# Patient Record
Sex: Female | Born: 2000 | Race: White | Hispanic: No | Marital: Married | Smoking: Never smoker
Health system: Southern US, Community
[De-identification: ages and names within clinical notes are randomized; demographics above are authoritative.]

## PROBLEM LIST (undated history)

## (undated) DIAGNOSIS — L709 Acne, unspecified: Secondary | ICD-10-CM

## (undated) DIAGNOSIS — K5790 Diverticulosis of intestine, part unspecified, without perforation or abscess without bleeding: Secondary | ICD-10-CM

## (undated) HISTORY — DX: Diverticulosis of intestine, part unspecified, without perforation or abscess without bleeding: K57.90

## (undated) HISTORY — PX: WISDOM TOOTH EXTRACTION: SHX21

## (undated) HISTORY — DX: Acne, unspecified: L70.9

## (undated) HISTORY — PX: COSMETIC SURGERY: SHX468

---
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
## 2018-01-17 ENCOUNTER — Encounter: Payer: Self-pay | Admitting: Certified Nurse Midwife

## 2018-01-17 ENCOUNTER — Ambulatory Visit: Payer: BLUE CROSS/BLUE SHIELD | Admitting: Certified Nurse Midwife

## 2018-01-17 ENCOUNTER — Other Ambulatory Visit: Payer: Self-pay

## 2018-01-17 VITALS — BP 110/70 | HR 70 | Resp 16 | Ht 63.75 in | Wt 159.0 lb

## 2018-01-17 DIAGNOSIS — Z01419 Encounter for gynecological examination (general) (routine) without abnormal findings: Secondary | ICD-10-CM

## 2018-01-17 DIAGNOSIS — N920 Excessive and frequent menstruation with regular cycle: Secondary | ICD-10-CM | POA: Diagnosis not present

## 2018-01-17 DIAGNOSIS — N946 Dysmenorrhea, unspecified: Secondary | ICD-10-CM | POA: Diagnosis not present

## 2018-01-17 LAB — HEMOGLOBIN: Hemoglobin: 14.3

## 2018-01-17 MED ORDER — NORETHINDRONE 0.35 MG PO TABS: 1.0000 | ORAL_TABLET | Freq: Every day | ORAL | 1 refills | Status: DC

## 2018-01-17 NOTE — Progress Notes (Signed)
17 y.o. G0P0000 Single  Caucasian Fe here for annual exam. Periods are 6-7 duration, first 4 days heavy with cramping and then moderate to light, some PMS. Not sexually active ever. Previous use of OCP for cycle control only with some good results. Patient had gained weight with use and stopped. Periods are still uncomfortable and very heavy, would like to discuss other options for management. Social stress with mother's diagnosis of breast cancer, first chemo today. Has returned home from [place] to attend at [employer] to be able to help. No other health issues today.     Patient's last menstrual period was 12/29/2017 (exact date).          Sexually active: No.  The current method of family planning is abstinence.  Never sexually active  Exercising: Yes.    cardio Smoker:  no  Health Maintenance: Pap:  never History of Abnormal Pap: no MMG:  none Self Breast exams: no Colonoscopy:  none BMD:   none TDaP:  2018 Shingles: no Pneumonia: no Hep C and HIV: not done Labs: if needed  POCT Hgb 14.3   reports that she has never smoked. She has never used smokeless tobacco. She reports that she does not drink alcohol or use drugs.  Past Medical History:  Diagnosis Date  . Acne     Past Surgical History:  Procedure Laterality Date  . COSMETIC SURGERY     removal of birthmark  . WISDOM TOOTH EXTRACTION      Current Outpatient Medications  Medication Sig Dispense Refill  . clindamycin-benzoyl peroxide (BENZACLIN) gel APPLY TO FACE EVERY MORNING  2  . tretinoin (RETIN-A) 0.05 % cream APPLY TO FACE EVERY EVENING  1   No current facility-administered medications for this visit.     Family History  Problem Relation Age of Onset  . Diabetes Mother   . Cancer Mother 30       Breast   . Diabetes Sister   . Hypertension Maternal Grandmother   . Hypertension Maternal Grandfather   . Throat cancer Paternal Grandfather     ROS:  Pertinent items are noted in HPI.  Otherwise, a  comprehensive ROS was negative.  Exam:   BP 110/70   Pulse 70   Resp 16   Ht 5' 3.75" (1.619 m)   Wt 159 lb (72.1 kg)   LMP 12/29/2017 (Exact Date)   BMI 27.51 kg/m  Height: 5' 3.75" (161.9 cm) Ht Readings from Last 3 Encounters:  01/17/18 5' 3.75" (1.619 m) (42 %, Z= -0.20)*  11/16/17  (1.626 m) (46 %, Z= -0.10)*  12/19/16  (1.6 m) (32 %, Z= -0.47)*   * Growth percentiles are based on CDC (Girls, 2-20 Years) data.    General appearance: alert, cooperative and appears stated age Head: Normocephalic, without obvious abnormality, atraumatic Neck: no adenopathy, supple, symmetrical, trachea midline and thyroid normal to inspection and palpation Lungs: clear to auscultation bilaterally Breasts: normal appearance, no masses or tenderness, No nipple retraction or dimpling, No nipple discharge or bleeding, No axillary or supraclavicular adenopathy, Taught monthly breast self examination Heart: regular rate and rhythm Abdomen: soft, non-tender; no masses,  no organomegaly Extremities: extremities normal, atraumatic, no cyanosis or edema Skin: Skin color, texture, turgor normal. No rashes or lesions Lymph nodes: Cervical, supraclavicular, and axillary nodes normal. No abnormal inguinal nodes palpated Neurologic: Grossly normal   Pelvic: External genitalia:  no lesions, normal female for her age  Urethra:  normal appearing urethra with no masses, tenderness or lesions              Bartholin's and Skene's: normal                 Vagina: normal appearing vagina with normal color and discharge, no lesions              Cervix: no cervical motion tenderness, no lesions and nulliparous appearance              Pap taken: No. Bimanual Exam:  Uterus:  normal size, contour, position, consistency, mobility, non-tender and mid position              Adnexa: normal adnexa and no mass, fullness, tenderness               Rectovaginal: Confirms               Anus:  normal  appearance  Chaperone present: yes  A:  Well Woman with normal exam  Menorrhagia with dysmenorrhea desires cycle control  Social stress with mother's breast cancer diagnosis  Contraception options  P:   Reviewed health and wellness pertinent to exam  Discussed options for management with POP, Nexplanon, IUD, Depo Provera, risks/benefits/warning signs, expectations. Discussed since she has done pills prior with no problems with consistent use this may be a good option to start. Patient agreeable, declines any other form. Discussed expectations with POP of normal period, spotting, or no period. Will have 3 month adjustment period at onset and should help with heavy cycle. Instructions given to start on first day of period and continue daily, no off days. Voiced understanding. Will need 3 month evaluation. Discussed Midol use at onset of period symptoms, prior to bleeding to see if this helps with cramping. Hydrate well with water also questions addressed.  Discussed OCP has not shown increase risk of breast cancer due to estrogen, but understand her concern. Encouraged to see other family and friend and church support regarding her emotional needs.  Pap smear: no  counseled on breast self exam, use and side effects of OCP's, adequate intake of calcium and vitamin D, diet and exercise which help with menstrual cycle.  return annually or prn, as above  An After Visit Summary was printed and given to the patient.

## 2018-01-17 NOTE — Patient Instructions (Signed)
Take one pill daily every day one pack to the next. Call if issues.

## 2018-04-12 ENCOUNTER — Ambulatory Visit (INDEPENDENT_AMBULATORY_CARE_PROVIDER_SITE_OTHER): Payer: BLUE CROSS/BLUE SHIELD | Admitting: Certified Nurse Midwife

## 2018-04-12 ENCOUNTER — Encounter: Payer: Self-pay | Admitting: Certified Nurse Midwife

## 2018-04-12 ENCOUNTER — Other Ambulatory Visit: Payer: Self-pay

## 2018-04-12 VITALS — BP 126/82 | HR 66 | Resp 14 | Ht 63.0 in | Wt 162.5 lb

## 2018-04-12 DIAGNOSIS — Z3041 Encounter for surveillance of contraceptive pills: Secondary | ICD-10-CM

## 2018-04-12 DIAGNOSIS — N920 Excessive and frequent menstruation with regular cycle: Secondary | ICD-10-CM | POA: Diagnosis not present

## 2018-04-12 MED ORDER — NORETHINDRONE 0.35 MG PO TABS: 1.0000 | ORAL_TABLET | Freq: Every day | ORAL | 3 refills | Status: DC

## 2018-04-12 NOTE — Progress Notes (Signed)
17 y.o. Single Caucasian G0P0000 here for evaluation of Ashlee Schmidt initiated on 01/27/18 for cycle control for menorrhagia and dysmenorrhea. Menses duration 4 days with lighter flow just recently. Patient taking medication as prescribed. Denies  headaches, nausea, DVT warning signs or symptoms, no breakthrough bleeding, or other changes.   Keeping menses calendar.  Still caring for mother who is continuing with treatment for breast cancer and feel stress is contributing to not adjusting as well. 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  No other health issues today  O: Healthy female, WD WN Affect: normal orientation X 3    A: History of  Menorrhagia and dysmenorrhea cycle improving from patient perspective.   P: Continue Camilla as prescribed and advise if does not continue improve cycle control.   17 minutes spent with patient regarding contraception management

## 2018-06-26 ENCOUNTER — Telehealth: Payer: Self-pay | Admitting: Certified Nurse Midwife

## 2018-06-26 NOTE — Telephone Encounter (Signed)
Reviewed with Leota Sauers, CNM. Call returned to patient, Leota Sauers, CNM, agrees with recommendations, f/u with PCP for further evaluation. Patient verbalizes understanding and is agreeable. Encounter closed.

## 2018-06-26 NOTE — Telephone Encounter (Signed)
Patient sent the following message through MyChart. Routing to triage to assist patient with request.   Hi, [name]!    Its Ashlee Schmidt! So I just started my cycle, and symptoms preceding actually starting my period have been significantly better. But once I start my actual period my pain is about an 8 out of 10 for this one. Is there any advice on what can alleviate it? I am drinking around 70 oz. of water a day and staying on top of taking Midol for pain.

## 2018-06-26 NOTE — Telephone Encounter (Signed)
Spoke with patient. Patient has been on POP now for a few months, has been working well to control back pain and discomfort prior to start of menses. Menses started today, reports lower abdominal pelvic pain 8/10, not relieved by midol. Patient reports 6 loose stools today, BMs have been loose for 3 days. Nausea, denies vomiting. Has not checked temp, "feels warm". Has been hydrating well.   Patient states she does have a PCP, recommended patient f/u with PCP for further evaluation, if appt still needed with GYN, return call to schedule. Advised I will review with Leota Sauers, CNM and return call with any additional recommendations. Patient is agreeable.   Leota Sauers, CNM -please review.

## 2018-08-14 DIAGNOSIS — L700 Acne vulgaris: Secondary | ICD-10-CM | POA: Diagnosis not present

## 2018-09-19 ENCOUNTER — Other Ambulatory Visit: Payer: Self-pay

## 2018-09-19 ENCOUNTER — Encounter: Payer: Self-pay | Admitting: Certified Nurse Midwife

## 2018-09-19 ENCOUNTER — Ambulatory Visit: Payer: BLUE CROSS/BLUE SHIELD | Admitting: Certified Nurse Midwife

## 2018-09-19 VITALS — BP 110/70 | HR 68 | Resp 16 | Wt 169.0 lb

## 2018-09-19 DIAGNOSIS — N6001 Solitary cyst of right breast: Secondary | ICD-10-CM

## 2018-09-19 DIAGNOSIS — N644 Mastodynia: Secondary | ICD-10-CM | POA: Diagnosis not present

## 2018-09-19 DIAGNOSIS — Z803 Family history of malignant neoplasm of breast: Secondary | ICD-10-CM

## 2018-09-19 NOTE — Patient Instructions (Signed)

## 2018-09-19 NOTE — Progress Notes (Signed)
   Subjective:   18 y.o. Single Caucasian female presents for evaluation of right breast tenderness. Onset of the symptoms was 5 days. Patient sought evaluation because of right breast tenderness, no nipple discharge or mass or redness noted.  Contributing factors include family hx with mother, invasive breast cancer and currently being treated.  Patient denies history of trauma, bites, or injuries, lifting heavy object or new bra. Not sexually active ever. Does use caffeine but has decreased amount.. Last mammogram was none. Patient's period due in the next 4 days and does have premenstrual tenderness, but this is different.. Has never had breast tenderness with menses in past. No other health concerns today.  Review of Systems Review of Systems  Constitutional: Negative.   HENT: Negative.   Eyes: Negative.   Respiratory: Negative.   Cardiovascular: Negative.        No breast pain, but right breast tenderness  Gastrointestinal: Negative.   Genitourinary: Negative.   Skin: Negative.   Neurological: Negative.   Endo/Heme/Allergies: Negative.   Psychiatric/Behavioral: Negative.       Objective:    Physical Exam Chest:       Assessment:   ASSESSMENT:Patient is diagnosed with right breast tenderness possible premenstrual. Questionable breast cyst noted around 3 o'clock 1-2 FB from outer edge.  Family history of invasive breast cancer with mother at age 67 .   Plan:   PLAN: Patient will be scheduled for right breast US prior to leaving. Patient agreeable. Discussed premenstrual breast tenderness and caffeine affect on breast also. Encouraged to decrease caffeine use. Printed information given.  Rv prn

## 2018-09-19 NOTE — Progress Notes (Signed)
Patient scheduled while in office for right breast US at [facility] on 09/25/18 at 7:30am, arriving at 7:15am. Patient verbalizes understanding and is agreeable.

## 2018-09-25 ENCOUNTER — Ambulatory Visit
Admission: RE | Admit: 2018-09-25 | Discharge: 2018-09-25 | Disposition: A | Discharge status: Home / Self Care | Payer: BLUE CROSS/BLUE SHIELD | Source: Ambulatory Visit | Attending: Certified Nurse Midwife | Admitting: Certified Nurse Midwife

## 2018-09-25 ENCOUNTER — Telehealth: Payer: Self-pay | Admitting: *Deleted

## 2018-09-25 DIAGNOSIS — Z803 Family history of malignant neoplasm of breast: Secondary | ICD-10-CM | POA: Diagnosis not present

## 2018-09-25 DIAGNOSIS — N644 Mastodynia: Secondary | ICD-10-CM

## 2018-09-25 NOTE — Telephone Encounter (Signed)
-----   Message from Verner Chol, CNM sent at 09/25/2018 10:54 AM EST ----- Notify patient I have reviewed her US of the right breast and no abnormality noted, just  Normal fibroglandular tissue. If tenderness is still present she needs follow up exam in office. Felt she may have had a cyst at time seen, which can resolve if premenstrual.

## 2018-09-25 NOTE — Telephone Encounter (Signed)
Message left to return call to Lemarcus Baggerly at [phone].    

## 2018-09-30 NOTE — Telephone Encounter (Signed)
Message left for patient and my-chart message sent as well.  

## 2018-10-01 NOTE — Telephone Encounter (Signed)
Patient responded via mychart:  She feels better. No recheck since improved. Out of hold per Dr. Hyacinth Meeker  Routing to provider and will close encounter.    ----- Message -----  From: Renita Papa Pettinger  Sent: 10/01/2018 10:40 AM EST  To: Nurse Marc Morgans F Subject: RE: Follow up  Brantley Persons!  I am so sorry, this past week has been crazy getting school put together and work. But I am doing better. I have lowered my caffeine intake to a cup of coffee a day and I see that has helped. And the radiologist recommended some things that I am trying!   Thank you so much!

## 2018-11-15 DIAGNOSIS — D224 Melanocytic nevi of scalp and neck: Secondary | ICD-10-CM | POA: Diagnosis not present

## 2018-11-15 DIAGNOSIS — L700 Acne vulgaris: Secondary | ICD-10-CM | POA: Diagnosis not present

## 2019-01-01 DIAGNOSIS — Z803 Family history of malignant neoplasm of breast: Secondary | ICD-10-CM | POA: Diagnosis not present

## 2019-01-01 DIAGNOSIS — Z1331 Encounter for screening for depression: Secondary | ICD-10-CM | POA: Diagnosis not present

## 2019-01-01 DIAGNOSIS — L700 Acne vulgaris: Secondary | ICD-10-CM | POA: Diagnosis not present

## 2019-01-01 DIAGNOSIS — N920 Excessive and frequent menstruation with regular cycle: Secondary | ICD-10-CM | POA: Diagnosis not present

## 2019-02-20 DIAGNOSIS — F419 Anxiety disorder, unspecified: Secondary | ICD-10-CM | POA: Diagnosis not present

## 2019-03-13 DIAGNOSIS — F419 Anxiety disorder, unspecified: Secondary | ICD-10-CM | POA: Diagnosis not present

## 2019-04-03 DIAGNOSIS — F419 Anxiety disorder, unspecified: Secondary | ICD-10-CM | POA: Diagnosis not present

## 2019-04-24 DIAGNOSIS — F419 Anxiety disorder, unspecified: Secondary | ICD-10-CM | POA: Diagnosis not present

## 2019-05-15 DIAGNOSIS — N920 Excessive and frequent menstruation with regular cycle: Secondary | ICD-10-CM | POA: Diagnosis not present

## 2019-05-15 DIAGNOSIS — N946 Dysmenorrhea, unspecified: Secondary | ICD-10-CM | POA: Diagnosis not present

## 2019-05-15 DIAGNOSIS — L709 Acne, unspecified: Secondary | ICD-10-CM | POA: Diagnosis not present

## 2019-05-21 DIAGNOSIS — F419 Anxiety disorder, unspecified: Secondary | ICD-10-CM | POA: Diagnosis not present

## 2019-06-04 ENCOUNTER — Other Ambulatory Visit: Payer: Self-pay | Admitting: Certified Nurse Midwife

## 2019-06-04 DIAGNOSIS — N920 Excessive and frequent menstruation with regular cycle: Secondary | ICD-10-CM

## 2019-06-04 NOTE — Telephone Encounter (Signed)
Medication refill request: Micronor Last AEX:  01/17/2018 DL Next AEX: none - left message to schedule Last MMG (if hormonal medication request): n/a Refill authorized: Pending #[phone] with no refills as requested by pharmacy if appropriate. Please advise.

## 2019-06-06 DIAGNOSIS — Z23 Encounter for immunization: Secondary | ICD-10-CM | POA: Diagnosis not present

## 2019-07-10 DIAGNOSIS — N921 Excessive and frequent menstruation with irregular cycle: Secondary | ICD-10-CM | POA: Diagnosis not present

## 2019-07-17 IMAGING — US ULTRASOUND RIGHT BREAST LIMITED
1 series · 4 of 4 positions shown · non-contrast
Comparison: None.

CLINICAL DATA: 17-year-old presenting with pain involving the UPPER
OUTER QUADRANT of the RIGHT breast which began approximately 2 weeks
ago and a possible palpable lump in the INNER RIGHT breast on recent
clinical examination.

Family history of breast cancer in her mother at age 41. The mother
is BRCA negative.
EXAM:
ULTRASOUND OF THE RIGHT BREAST

[Series 1: ultrasound right breast limited · 0.07mm/px · 4 of 4 slices shown]
[im 1/4]
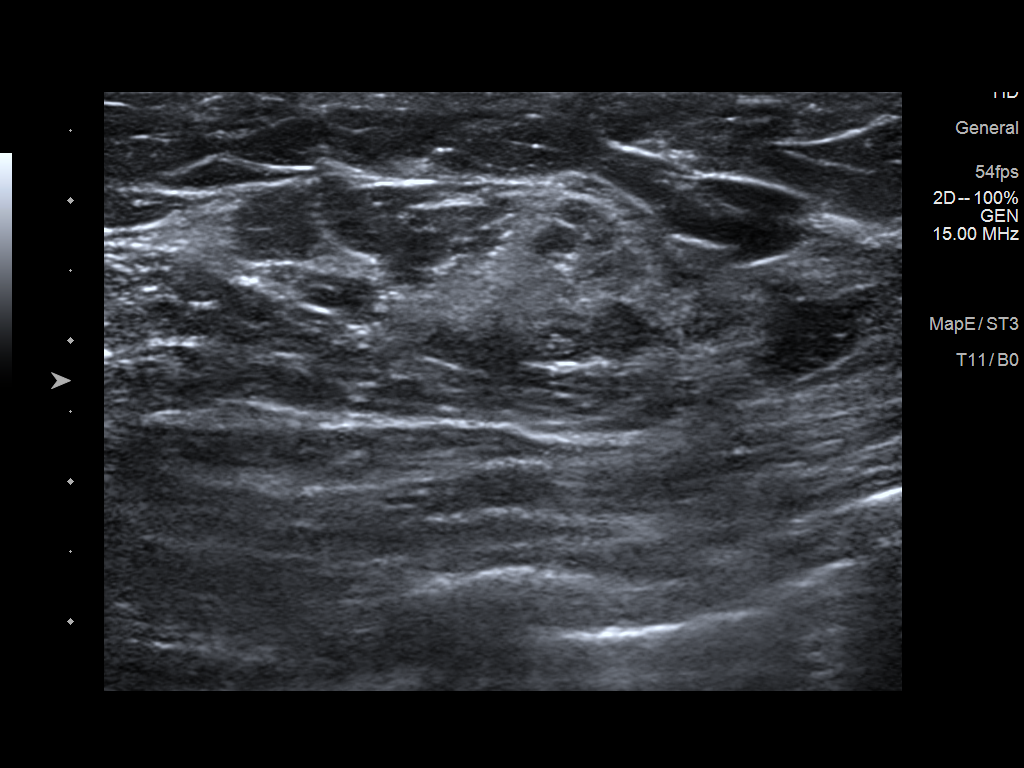
[im 2/4]
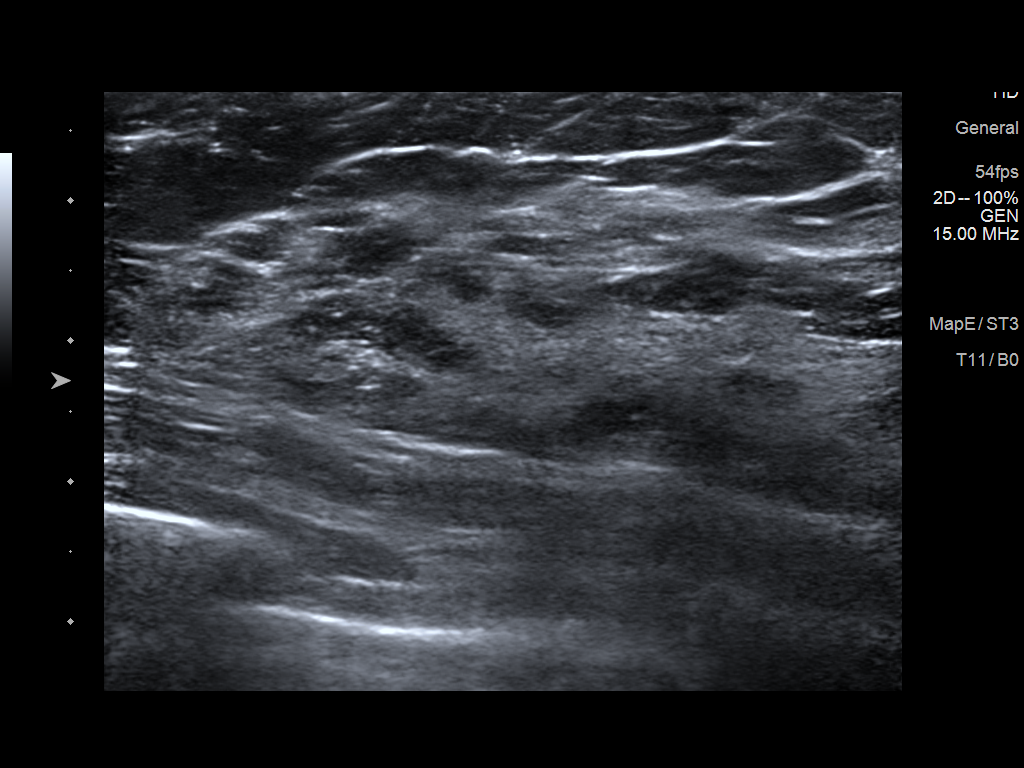
[im 3/4]
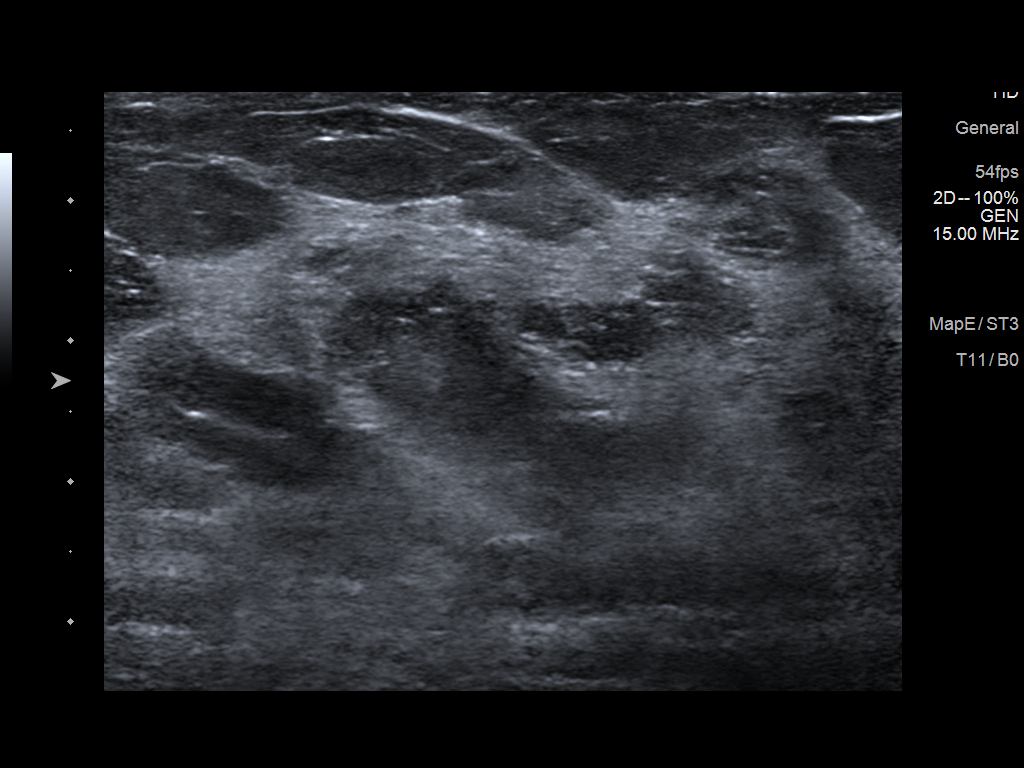
[im 4/4]
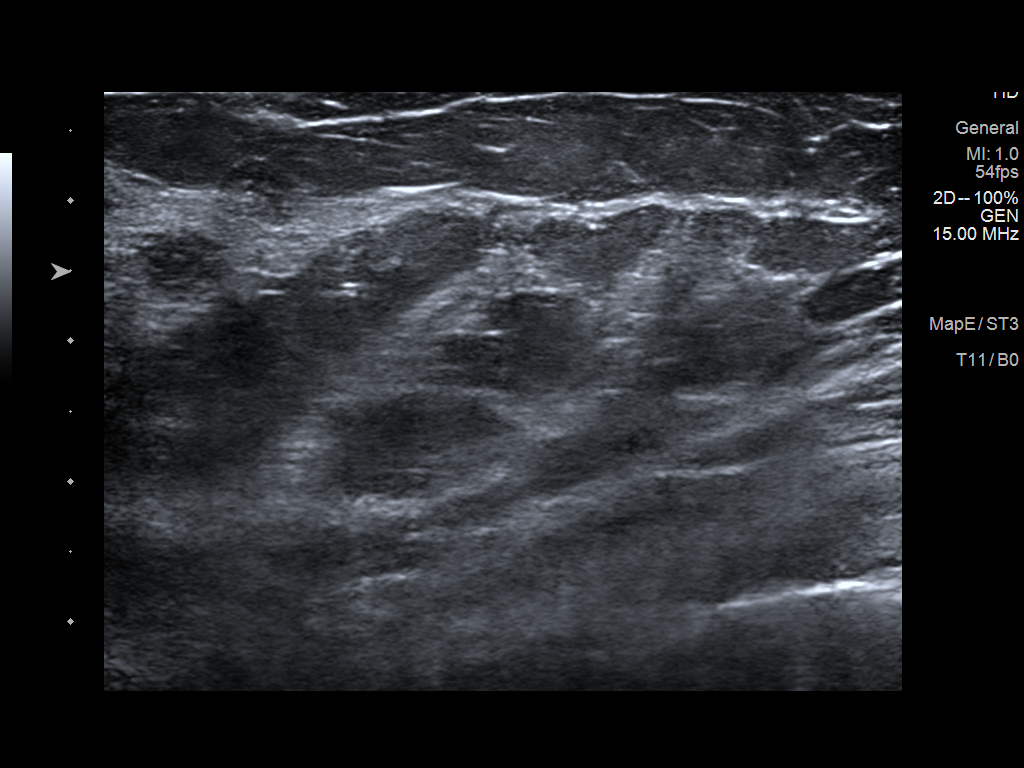

[4 of 4 positions shown; findings below may reference images not displayed]

FINDINGS: On physical exam, there is no palpable abnormality in the UPPER
OUTER QUADRANT of the RIGHT breast in the area of pain. The patient
describes tenderness to palpation. I do not palpate a discrete mass
in the INNER RIGHT breast at the site of clinical concern.

Targeted RIGHT breast ultrasound is performed, showing normal
fibroglandular tissue in the UPPER OUTER QUADRANT in the area pain
at the 10 to 11 o'clock position approximately 10 cm from nipple.
Normal fibroglandular tissue is present in the INNER breast at the
site of the clinical palpable concern at the approximate 3 o'clock
position 3 cm from the nipple. No cyst, solid mass or abnormal
acoustic shadowing is identified.
IMPRESSION: Normal examination.

RECOMMENDATION:
Annual screening mammography to begin at age 29 (10 years earlier
than the age of diagnosis of her mother).

I have discussed the findings and recommendations with the patient.
Results were also provided in writing at the conclusion of the
visit. If applicable, a reminder letter will be sent to the patient
regarding the next appointment.

BI-RADS CATEGORY  1: Negative.

## 2019-12-01 ENCOUNTER — Encounter: Payer: Self-pay | Admitting: Certified Nurse Midwife

## 2019-12-03 ENCOUNTER — Encounter: Payer: Self-pay | Admitting: Certified Nurse Midwife

## 2022-09-01 DIAGNOSIS — Z01419 Encounter for gynecological examination (general) (routine) without abnormal findings: Secondary | ICD-10-CM | POA: Diagnosis not present

## 2022-09-01 DIAGNOSIS — Z6826 Body mass index (BMI) 26.0-26.9, adult: Secondary | ICD-10-CM | POA: Diagnosis not present

## 2022-09-01 LAB — CYTOLOGY - PAP: Pap Smear: NEGATIVE

## 2022-09-29 ENCOUNTER — Emergency Department (HOSPITAL_BASED_OUTPATIENT_CLINIC_OR_DEPARTMENT_OTHER)
Admission: EM | Admit: 2022-09-29 | Discharge: 2022-09-29 | Disposition: A | Discharge status: Home / Self Care | Payer: BC Managed Care – PPO | Attending: Emergency Medicine | Admitting: Emergency Medicine

## 2022-09-29 ENCOUNTER — Other Ambulatory Visit: Payer: Self-pay

## 2022-09-29 ENCOUNTER — Emergency Department (HOSPITAL_BASED_OUTPATIENT_CLINIC_OR_DEPARTMENT_OTHER): Payer: BC Managed Care – PPO

## 2022-09-29 ENCOUNTER — Encounter (HOSPITAL_BASED_OUTPATIENT_CLINIC_OR_DEPARTMENT_OTHER): Payer: Self-pay | Admitting: Radiology

## 2022-09-29 DIAGNOSIS — Z79899 Other long term (current) drug therapy: Secondary | ICD-10-CM | POA: Diagnosis not present

## 2022-09-29 DIAGNOSIS — A0839 Other viral enteritis: Secondary | ICD-10-CM | POA: Diagnosis not present

## 2022-09-29 DIAGNOSIS — A084 Viral intestinal infection, unspecified: Secondary | ICD-10-CM

## 2022-09-29 DIAGNOSIS — K5900 Constipation, unspecified: Secondary | ICD-10-CM | POA: Diagnosis not present

## 2022-09-29 DIAGNOSIS — R109 Unspecified abdominal pain: Secondary | ICD-10-CM | POA: Diagnosis not present

## 2022-09-29 DIAGNOSIS — R1031 Right lower quadrant pain: Secondary | ICD-10-CM | POA: Diagnosis not present

## 2022-09-29 LAB — CBC WITH DIFFERENTIAL/PLATELET
Abs Immature Granulocytes: 0.01 10*3/uL (ref 0.00–0.07)
Basophils Absolute: 0.1 10*3/uL (ref 0.0–0.1)
Basophils Relative: 1 %
Eosinophils Absolute: 0.1 10*3/uL (ref 0.0–0.5)
Eosinophils Relative: 1 %
HCT: 42.1 % (ref 36.0–46.0)
Hemoglobin: 14.7 g/dL (ref 12.0–15.0)
Immature Granulocytes: 0 %
Lymphocytes Relative: 41 %
Lymphs Abs: 4.3 10*3/uL — ABNORMAL HIGH (ref 0.7–4.0)
MCH: 28.4 pg (ref 26.0–34.0)
MCHC: 34.9 g/dL (ref 30.0–36.0)
MCV: 81.4 fL (ref 80.0–100.0)
Monocytes Absolute: 0.8 10*3/uL (ref 0.1–1.0)
Monocytes Relative: 8 %
Neutro Abs: 5.1 10*3/uL (ref 1.7–7.7)
Neutrophils Relative %: 49 %
Platelets: 358 10*3/uL (ref 150–400)
RBC: 5.17 MIL/uL — ABNORMAL HIGH (ref 3.87–5.11)
RDW: 11.9 % (ref 11.5–15.5)
WBC: 10.4 10*3/uL (ref 4.0–10.5)
nRBC: 0 % (ref 0.0–0.2)

## 2022-09-29 LAB — BASIC METABOLIC PANEL
Anion gap: 11 (ref 5–15)
BUN: 9 mg/dL (ref 6–20)
CO2: 23 mmol/L (ref 22–32)
Calcium: 10.1 mg/dL (ref 8.9–10.3)
Chloride: 102 mmol/L (ref 98–111)
Creatinine, Ser: 0.79 mg/dL (ref 0.44–1.00)
GFR, Estimated: >60 mL/min (ref >60)
Glucose, Bld: 95 mg/dL (ref 70–99)
Potassium: 4.2 mmol/L (ref 3.5–5.1)
Sodium: 136 mmol/L (ref 135–145)

## 2022-09-29 LAB — LIPASE, BLOOD: Lipase: 24 U/L (ref 11–51)

## 2022-09-29 LAB — HEPATIC FUNCTION PANEL
ALT: 11 U/L (ref 0–44)
AST: 22 U/L (ref 15–41)
Albumin: 4.7 g/dL (ref 3.5–5.0)
Alkaline Phosphatase: 60 U/L (ref 38–126)
Bilirubin, Direct: 0.1 mg/dL (ref 0.0–0.2)
Indirect Bilirubin: 0.6 mg/dL (ref 0.3–0.9)
Total Bilirubin: 0.7 mg/dL (ref 0.3–1.2)
Total Protein: 8.2 g/dL — ABNORMAL HIGH (ref 6.5–8.1)

## 2022-09-29 LAB — URINALYSIS, ROUTINE W REFLEX MICROSCOPIC
Bilirubin Urine: NEGATIVE
Glucose, UA: NEGATIVE mg/dL
Hgb urine dipstick: NEGATIVE
Ketones, ur: NEGATIVE mg/dL
Leukocytes,Ua: NEGATIVE
Nitrite: NEGATIVE
Protein, ur: NEGATIVE mg/dL
Specific Gravity, Urine: 1.009 (ref 1.005–1.030)
pH: 6.5 (ref 5.0–8.0)

## 2022-09-29 LAB — PREGNANCY, URINE: Preg Test, Ur: NEGATIVE

## 2022-09-29 MED ORDER — AMOXICILLIN-POT CLAVULANATE 875-125 MG PO TABS
1.0000 | ORAL_TABLET | Freq: Once | ORAL | Status: AC
  Administered 2022-09-29: 1 via ORAL
  Filled 2022-09-29: qty 1

## 2022-09-29 MED ORDER — IOHEXOL 300 MG/ML  SOLN
100.0000 mL | Freq: Once | INTRAMUSCULAR | Status: AC | PRN
  Administered 2022-09-29: 80 mL via INTRAVENOUS

## 2022-09-29 MED ORDER — ONDANSETRON HCL 4 MG/2ML IJ SOLN
4.0000 mg | Freq: Once | INTRAMUSCULAR | Status: AC
  Administered 2022-09-29: 4 mg via INTRAVENOUS
  Filled 2022-09-29: qty 2

## 2022-09-29 MED ORDER — AMOXICILLIN-POT CLAVULANATE 875-125 MG PO TABS: 1.0000 | ORAL_TABLET | Freq: Two times a day (BID) | ORAL | 0 refills | Status: AC

## 2022-09-29 MED ORDER — KETOROLAC TROMETHAMINE 30 MG/ML IJ SOLN
30.0000 mg | Freq: Once | INTRAMUSCULAR | Status: AC
  Administered 2022-09-29: 30 mg via INTRAVENOUS
  Filled 2022-09-29: qty 1

## 2022-09-29 MED ORDER — MORPHINE SULFATE (PF) 4 MG/ML IV SOLN
4.0000 mg | Freq: Once | INTRAVENOUS | Status: AC
  Administered 2022-09-29: 4 mg via INTRAVENOUS
  Filled 2022-09-29: qty 1

## 2022-09-29 NOTE — ED Triage Notes (Signed)
Pt woke with severe pain on the right side of her abd this morning around 6am. Pain got better but has not went away. Pt pushed on her stomach and had severe pain on her right lower side. Pt primary care referred her to come here to rule out appendicitis.

## 2022-09-29 NOTE — ED Provider Notes (Signed)
[facility] EMERGENCY DEPT Provider Note   CSN: [id number] Arrival date & time: 09/29/22  1824     History  Chief Complaint  Patient presents with   Abdominal Pain    Ashlee Schmidt is a 22 y.o. female present emerged apartment abdominal pain.  She reports she woke up with pain in her right lower quadrant earlier today that was significant.  She has never had this before.  She does suffer from chronic constipation but had a regular bowel movement earlier today, and says there were some similarities to her constipation pain, but it is never been this severe.  She reports some nausea and poor appetite today.  She went to work where she says the pain gradually got better but never went away, now it is returning.  She took Tylenol at work.  She denies any history of abdominal surgery.  She denies any other medical problems.  HPI     Home Medications Prior to Admission medications   Medication Sig Start Date End Date Taking? Authorizing Provider  amoxicillin-clavulanate (AUGMENTIN) 875-125 MG tablet Take 1 tablet by mouth every 12 (twelve) hours for 7 days. 09/29/22 10/06/22 Yes Ziva Nunziata, Kermit Balo, MD  clindamycin-benzoyl peroxide (BENZACLIN) gel APPLY TO FACE EVERY MORNING 09/28/16   [provider]  clonazePAM (KLONOPIN) 0.5 MG tablet TAKE 1 TABLET BY MOUTH DAILY AS NEEDED ANXIETY. TRY TO LIMIT USE TO  3X PER WEEK. 02/20/19   [provider]  hydrochlorothiazide (HYDRODIURIL) 12.5 MG tablet TAKE 1 TABLET BY MOUTH EVERY DAY AS NEEDED FOR SWELLING 03/26/19   [provider]  norethindrone (MICRONOR) 0.35 MG tablet TAKE 1 TABLET BY MOUTH EVERY DAY 06/04/19   Verner Chol, CNM  tretinoin (RETIN-A) 0.05 % cream APPLY TO FACE EVERY EVENING 09/28/16   [provider]  UNABLE TO FIND Acne pill for face    [provider]      Allergies    Patient has no known allergies.    Review of Systems   Review of Systems  Physical Exam Updated  Vital Signs BP 113/80   Pulse 69   Temp 98.3 F (36.8 C) (Oral)   Resp 18   Ht 5\' 3"  (1.6 m)   Wt 70.3 kg   SpO2 100%   BMI 27.46 kg/m  Physical Exam Constitutional:      General: She is not in acute distress. HENT:     Head: Normocephalic and atraumatic.  Eyes:     Conjunctiva/sclera: Conjunctivae normal.     Pupils: Pupils are equal, round, and reactive to light.  Cardiovascular:     Rate and Rhythm: Normal rate and regular rhythm.  Pulmonary:     Effort: Pulmonary effort is normal. No respiratory distress.  Abdominal:     General: There is no distension.     Tenderness: There is abdominal tenderness in the right upper quadrant. There is no rebound. Positive signs include Murphy's sign and McBurney's sign.  Skin:    General: Skin is warm and dry.  Neurological:     General: No focal deficit present.     Mental Status: She is alert. Mental status is at baseline.  Psychiatric:        Mood and Affect: Mood normal.        Behavior: Behavior normal.     ED Results / Procedures / Treatments   Labs (all labs ordered are listed, but only abnormal results are displayed) Labs Reviewed  CBC WITH DIFFERENTIAL/PLATELET - Abnormal;  Notable for the following components:      Result Value   RBC 5.17 (*)    Lymphs Abs 4.3 (*)    All other components within normal limits  URINALYSIS, ROUTINE W REFLEX MICROSCOPIC - Abnormal; Notable for the following components:   Color, Urine COLORLESS (*)    All other components within normal limits  HEPATIC FUNCTION PANEL - Abnormal; Notable for the following components:   Total Protein 8.2 (*)    All other components within normal limits  BASIC METABOLIC PANEL  PREGNANCY, URINE  LIPASE, BLOOD    EKG None  Radiology CT ABDOMEN PELVIS W CONTRAST  Result Date: 09/29/2022 CLINICAL DATA:  Right upper and lower quadrant abdominal pain with positive Murphy's sign. Assess gallbladder disease versus appendicitis. Onset this morning. EXAM: CT  ABDOMEN AND PELVIS WITH CONTRAST TECHNIQUE: Multidetector CT imaging of the abdomen and pelvis was performed using the standard protocol following bolus administration of intravenous contrast. RADIATION DOSE REDUCTION: This exam was performed according to the departmental dose-optimization program which includes automated exposure control, adjustment of the mA and/or kV according to patient size and/or use of iterative reconstruction technique. CONTRAST:  65mL OMNIPAQUE IOHEXOL 300 MG/ML  SOLN COMPARISON:  None Available. FINDINGS: Lower chest: No acute abnormality. Hepatobiliary: No focal liver abnormality is seen. No calcified gallstones, gallbladder wall thickening, or biliary dilatation. Pancreas: No abnormality. Spleen: No abnormality. Adrenals/Urinary Tract: There is no adrenal mass. There is homogeneous bilateral renal cortical enhancement without mass enhancement. There is no urinary stone or obstruction. Both ureters are small in caliber. Bladder is unremarkable for the degree of distention. Stomach/Bowel: The appendix is well visible and unremarkable. The gastric wall is unremarkable. There are mild thickened folds in the duodenum and jejunum, without inflammatory stranding in keeping with nonspecific enteritis. The rest of the small bowel is unremarkable. There is moderate stool retention ascending and proximal transverse colon. Scattered left colonic diverticula without evidence of diverticulitis. Vascular/Lymphatic: No significant vascular findings are present. No enlarged abdominal or pelvic lymph nodes. Reproductive: Uterus and bilateral adnexa are unremarkable. The ovaries are follicular but not enlarged. Other: There is minimal low-density fluid in the pelvic cul-de-sac, usually physiologic at this age. There is no free hemorrhage, free air, abscess or acute inflammatory change, or incarcerated hernias Musculoskeletal: No acute or significant osseous findings. IMPRESSION: 1. No CT evidence of acute  appendicitis. 2. Constipation and diverticulosis. 3. Mildly thickened folds in the duodenum and jejunum without inflammatory stranding, in keeping with nonspecific enteritis. 4. Minimal low-density fluid in the pelvic cul-de-sac, usually physiologic at this age. Electronically Signed   By: Telford Nab M.D.   On: 09/29/2022 20:09    Procedures Procedures    Medications Ordered in ED Medications  ketorolac (TORADOL) 30 MG/ML injection 30 mg (30 mg Intravenous Given 09/29/22 1952)  morphine (PF) 4 MG/ML injection 4 mg (4 mg Intravenous Given 09/29/22 1941)  ondansetron (ZOFRAN) injection 4 mg (4 mg Intravenous Given 09/29/22 1941)  iohexol (OMNIPAQUE) 300 MG/ML solution 100 mL (80 mLs Intravenous Contrast Given 09/29/22 1940)  amoxicillin-clavulanate (AUGMENTIN) 875-125 MG per tablet 1 tablet (1 tablet Oral Given 09/29/22 2047)    ED Course/ Medical Decision Making/ A&P                             Medical Decision Making Amount and/or Complexity of Data Reviewed Labs: ordered. Radiology: ordered.  Risk Prescription drug management.   This patient presents to  the ED with concern for abdominal pain. This involves an extensive number of treatment options, and is a complaint that carries with it a high risk of complications and morbidity.  The differential diagnosis includes acute biliary disease versus pancreatitis versus appendicitis versus constipation versus ureteral colic versus colitis versus UTI versus other  I ordered and personally interpreted labs.  The pertinent results include: No emergent findings  I ordered imaging studies including CT abdomen pelvis I independently visualized and interpreted imaging which showed constipation, enteritis findings I agree with the radiologist interpretation   I ordered medication including IV pain and nausea medication for abdominal pain and nausea  Test Considered: I have low suspicion for ovarian torsion, and I did not feel she needed a  pelvic ultrasound  After the interventions noted above, I reevaluated the patient and found that they have: improved   Dispostion:  After consideration of the diagnostic results and the patients response to treatment, I feel that the patent would benefit from outpatient referral to gastroenterology for chronic ongoing constipation.  She would also benefit from a bowel cleanout at home.  Ultimately we decided to start her on Augmentin for 7 days for potential coverage of bacterial infection, although explained most enteritis is are viral in nature, in this case her white blood cell count is elevated over her baseline and bacterial coverage is reasonable.  I do not see any other signs on her imaging concerning for acute appendicitis, acute biliary disease, or other surgical or infectious emergency.        Final Clinical Impression(s) / ED Diagnoses Final diagnoses:  Constipation, unspecified constipation type  Viral enteritis    Rx / DC Orders ED Discharge Orders          Ordered    Ambulatory referral to Gastroenterology       Comments: Constipation   09/29/22 2042    amoxicillin-clavulanate (AUGMENTIN) 875-125 MG tablet  Every 12 hours        09/29/22 2044              Wyvonnia Dusky, MD 09/29/22 2103

## 2022-09-29 NOTE — Discharge Instructions (Addendum)
Your CT scan showed that you signs of inflammation or bowels called enteritis.  You also have constipation.  For your constipation recommend that you continue using over-the-counter laxatives, combination of oral medicine such as Dulcolax or MiraLAX, as well as rectal suppository.  You can also consider magnesium citrate which is over-the-counter as a more powerful laxative medicine.  Enteritis means inflammation of the bowels.  It is typically caused by a viral infection.  However in your case, because your white blood cell count was higher than it normally is for you, I thought it was reasonable to treat for bacterial infection as well.  I will put you on 7 days of antibiotics called Augmentin.  Take these twice a day with meals.  Augmentin can cause some stomach upset and diarrhea as well.

## 2022-10-03 ENCOUNTER — Encounter: Payer: Self-pay | Admitting: Nurse Practitioner

## 2022-10-12 ENCOUNTER — Encounter: Payer: Self-pay | Admitting: *Deleted

## 2022-10-20 ENCOUNTER — Ambulatory Visit (INDEPENDENT_AMBULATORY_CARE_PROVIDER_SITE_OTHER): Payer: BC Managed Care – PPO | Admitting: Nurse Practitioner

## 2022-10-20 ENCOUNTER — Encounter: Payer: Self-pay | Admitting: Nurse Practitioner

## 2022-10-20 VITALS — BP 112/70 | HR 78 | Ht 63.0 in | Wt 157.0 lb

## 2022-10-20 DIAGNOSIS — K5909 Other constipation: Secondary | ICD-10-CM | POA: Diagnosis not present

## 2022-10-20 DIAGNOSIS — R1031 Right lower quadrant pain: Secondary | ICD-10-CM | POA: Diagnosis not present

## 2022-10-20 DIAGNOSIS — R9389 Abnormal findings on diagnostic imaging of other specified body structures: Secondary | ICD-10-CM

## 2022-10-20 NOTE — Patient Instructions (Signed)
You have been scheduled for a follow up with Tye Savoy, NP on 11/17/22 at 3:00 pm.  Please purchase the following medications over the counter and take as directed: Miralax 17 grams dissolved in at least 8 ounces water/juice once daily.  _______________________________________________________  If your blood pressure at your visit was 140/90 or greater, please contact your primary care physician to follow up on this.  _______________________________________________________  If you are age 24 or older, your body mass index should be between 23-30. Your Body mass index is 27.81 kg/m. If this is out of the aforementioned range listed, please consider follow up with your Primary Care Provider.  If you are age 24 or younger, your body mass index should be between 19-25. Your Body mass index is 27.81 kg/m. If this is out of the aformentioned range listed, please consider follow up with your Primary Care Provider.   ________________________________________________________  The [facility] providers would like to encourage you to use [facility] to communicate with providers for non-urgent requests or questions.  Due to long hold times on the telephone, sending your provider a message by [facility] may be a faster and more efficient way to get a response.  Please allow 48 business hours for a response.  Please remember that this is for non-urgent requests.  _______________________________________________________  Due to recent changes in healthcare laws, you may see the results of your imaging and laboratory studies on MyChart before your provider has had a chance to review them.  We understand that in some cases there may be results that are confusing or concerning to you. Not all laboratory results come back in the same time frame and the provider may be waiting for multiple results in order to interpret others.  Please give [place] 48 hours in order for your provider to thoroughly review all the results before  contacting the office for clarification of your results.

## 2022-10-20 NOTE — Progress Notes (Signed)
Assessment    Patient profile:  Ashlee Schmidt is a 22 y.o. year old female , new to the practice with a past medical history of chronic constipation   See PMH / PSH for additional history  # 22 yo female with chronic constipation with associated RLQ pain. Recent ED visit for worsening RLQ pain.  CT scan showing moderate stool retention in right colon as well as mild thickened folds in the duodenum and jejunum without inflammatory stranding suggestive of nonspecific enteritis. Unsure what to make of the small bowel findings. She didn't have any symptoms other than the RLQ pain which she has had before when constipated. Pain resolved now after laxatives and a week of antibiotics for " enteritis"    Plan:   Dr. Rush Landmark reviewed CT scan images. Will discussed case after she had already left the clinic.  Will discuss in more detail but plan is for ESR / CRP and possibly an enteroscopy. I called patient this afternoon to give her an update. She will come next week for labs.  Trial of daily Miralax for constipation. If not pleased with results then consider adding Amitiza 8 mcg BID at next follow up   HPI:    Chief Complaint: constipation, abdominal pain   Ashlee Schmidt has chronic constipation with associated RLQ pain. She went to the ED with acute worsening of RLQ pain on 09/29/2022. No vomiting but had some nausea when the RLQ pain became severe.  Labs and UA were unremarkable . Preg test negative. CT scan showed moderate stool retention in ascending and proximal transverse colon.. Also, mild thickened folds in the duodenum and jejunum without inflammatory stranding in keeping with nonspecific enteritis.  ED discharged her home with 7 days of antibiotics for enteritis.. She took laxatives , had good results and pain improved. Pain resolved after the 4th day of antibiotics.   Ashlee Schmidt has chronic constipation with infrequent bowel movements and hard stools. She takes a fiber gummy every day and miralax as  needed. She drinks nearly a gallon of water a day. She hasn't tried Miralax on a daily basis. Occasionally when constipation is severe she has minor rectal bleeding    Previous Labs / Imaging::    Latest Ref Rng & Units 09/29/2022    6:40 PM 11/16/2017    3:35 PM  CBC  WBC 4.0 - 10.5 K/uL 10.4  8.8   Hemoglobin 12.0 - 15.0 g/dL 14.7  14.5   Hematocrit 36.0 - 46.0 % 42.1  42.5   Platelets 150 - 400 K/uL 358  337     Lab Results  Component Value Date   LIPASE 24 09/29/2022      Latest Ref Rng & Units 09/29/2022    6:51 PM 09/29/2022    6:40 PM 11/16/2017    3:35 PM  CMP  Glucose 70 - 99 mg/dL  95  98   BUN 6 - 20 mg/dL  9  9   Creatinine 0.44 - 1.00 mg/dL  0.79  0.89   Sodium 135 - 145 mmol/L  136  140   Potassium 3.5 - 5.1 mmol/L  4.2  3.9   Chloride 98 - 111 mmol/L  102  104   CO2 22 - 32 mmol/L  23  26   Calcium 8.9 - 10.3 mg/dL  10.1  9.7   Total Protein 6.5 - 8.1 g/dL 8.2   7.1   Total Bilirubin 0.3 - 1.2 mg/dL 0.7   0.8   Alkaline  Phos 38 - 126 U/L 60     AST 15 - 41 U/L 22   23   ALT 0 - 44 U/L 11   19     Imaging:  CT ABDOMEN PELVIS W CONTRAST CLINICAL DATA:  Right upper and lower quadrant abdominal pain with positive Murphy's sign. Assess gallbladder disease versus appendicitis. Onset this morning.  EXAM: CT ABDOMEN AND PELVIS WITH CONTRAST  TECHNIQUE: Multidetector CT imaging of the abdomen and pelvis was performed using the standard protocol following bolus administration of intravenous contrast.  RADIATION DOSE REDUCTION: This exam was performed according to the departmental dose-optimization program which includes automated exposure control, adjustment of the mA and/or kV according to patient size and/or use of iterative reconstruction technique.  CONTRAST:  34mL OMNIPAQUE IOHEXOL 300 MG/ML  SOLN  COMPARISON:  None Available.  FINDINGS: Lower chest: No acute abnormality.  Hepatobiliary: No focal liver abnormality is seen. No calcified gallstones,  gallbladder wall thickening, or biliary dilatation.  Pancreas: No abnormality.  Spleen: No abnormality.  Adrenals/Urinary Tract: There is no adrenal mass. There is homogeneous bilateral renal cortical enhancement without mass enhancement. There is no urinary stone or obstruction. Both ureters are small in caliber. Bladder is unremarkable for the degree of distention.  Stomach/Bowel: The appendix is well visible and unremarkable. The gastric wall is unremarkable. There are mild thickened folds in the duodenum and jejunum, without inflammatory stranding in keeping with nonspecific enteritis.  The rest of the small bowel is unremarkable. There is moderate stool retention ascending and proximal transverse colon. Scattered left colonic diverticula without evidence of diverticulitis.  Vascular/Lymphatic: No significant vascular findings are present. No enlarged abdominal or pelvic lymph nodes.  Reproductive: Uterus and bilateral adnexa are unremarkable. The ovaries are follicular but not enlarged.  Other: There is minimal low-density fluid in the pelvic cul-de-sac, usually physiologic at this age. There is no free hemorrhage, free air, abscess or acute inflammatory change, or incarcerated hernias  Musculoskeletal: No acute or significant osseous findings.  IMPRESSION: 1. No CT evidence of acute appendicitis. 2. Constipation and diverticulosis. 3. Mildly thickened folds in the duodenum and jejunum without inflammatory stranding, in keeping with nonspecific enteritis. 4. Minimal low-density fluid in the pelvic cul-de-sac, usually physiologic at this age.  Electronically Signed   By: Telford Nab M.D.   On: 09/29/2022 20:09    Past Medical History:  Diagnosis Date   Acne    Diverticulosis    Past Surgical History:  Procedure Laterality Date   COSMETIC SURGERY     removal of birthmark   WISDOM TOOTH EXTRACTION     Family History  Problem Relation Age of Onset    Diabetes Mother    Cancer Mother 90       Breast    Diabetes Sister    Hypertension Maternal Grandmother    Hypertension Maternal Grandfather    Throat cancer Paternal Grandfather    Social History   Tobacco Use   Smoking status: Never   Smokeless tobacco: Never  Vaping Use   Vaping Use: Never used  Substance Use Topics   Alcohol use: No   Drug use: No   Current Outpatient Medications  Medication Sig Dispense Refill   clindamycin-benzoyl peroxide (BENZACLIN) gel APPLY TO FACE EVERY MORNING  2   clonazePAM (KLONOPIN) 0.5 MG tablet TAKE 1 TABLET BY MOUTH DAILY AS NEEDED ANXIETY. TRY TO LIMIT USE TO  3X PER WEEK.     hydrochlorothiazide (HYDRODIURIL) 12.5 MG tablet TAKE 1 TABLET BY  MOUTH EVERY DAY AS NEEDED FOR SWELLING     norethindrone (MICRONOR) 0.35 MG tablet TAKE 1 TABLET BY MOUTH EVERY DAY 84 tablet 0   tretinoin (RETIN-A) 0.05 % cream APPLY TO FACE EVERY EVENING  1   UNABLE TO FIND Acne pill for face     No current facility-administered medications for this visit.   No Known Allergies   Review of Systems: Positive for anxiety and depression . All other systems reviewed and negative except where noted in HPI.   Wt Readings from Last 3 Encounters:  09/29/22 155 lb (70.3 kg)  09/19/18 169 lb (76.7 kg) (92 %, Z= 1.38)*  04/12/18 162 lb 8 oz (73.7 kg) (90 %, Z= 1.26)*   * Growth percentiles are based on CDC (Girls, 2-20 Years) data.    Physical Exam   BP 112/70   Pulse 78   Ht 5\' 3"  (1.6 m)   Wt 157 lb (71.2 kg)   SpO2 98%   BMI 27.81 kg/m  Constitutional:  Pleasant, generally well appearing female in no acute distress. Psychiatric:  Normal mood and affect. Behavior is normal. EENT: Pupils normal.  Conjunctivae are normal. No scleral icterus. Neck supple.  Cardiovascular: Normal rate, regular rhythm.  Pulmonary/chest: Effort normal and breath sounds normal. No wheezing, rales or rhonchi. Abdominal: Soft, nondistended, nontender. Bowel sounds active  throughout. There are no masses palpable. No hepatomegaly. Neurological: Alert and oriented to person place and time. Skin: Skin is warm and dry. No rashes noted.  Tye Savoy, NP  10/20/2022, 8:20 AM

## 2022-10-21 NOTE — Progress Notes (Signed)
Attending Physician's Attestation   I have reviewed the chart.   I agree with the Advanced Practitioner's note, impression, and recommendations with any updates as below. I have also had an opportunity to review the imaging personally. Based on her current symptoms, I still think it is reasonable for inflammatory marker evaluation.  As her symptoms predated these findings for a while, and are now improved status post antibiotics and laxative therapy, I think it is reasonable to monitor her.  If her inflammatory markers remain markedly elevated, CT enterography in 4 to 6 weeks makes sense (even if patient is feeling better).  If her inflammatory markers are normal and she has recurrence of symptoms in the future then I would recommend CT enterography to ensure enteritis is no longer present and that we are not dealing with some form of indeterminant enteritis.  For now can hold on enteroscopy unless something else develops and/or imaging is repeated showing similar findings.     Justice Britain, MD [facility] Advanced [facility]oscopy Office # [phone]

## 2022-10-25 ENCOUNTER — Other Ambulatory Visit: Payer: Self-pay

## 2022-10-25 ENCOUNTER — Telehealth: Payer: Self-pay

## 2022-10-25 DIAGNOSIS — R1031 Right lower quadrant pain: Secondary | ICD-10-CM

## 2022-10-25 DIAGNOSIS — R9389 Abnormal findings on diagnostic imaging of other specified body structures: Secondary | ICD-10-CM

## 2022-10-25 DIAGNOSIS — K5909 Other constipation: Secondary | ICD-10-CM

## 2022-10-25 NOTE — Telephone Encounter (Signed)
-----   Message from Willia Craze, NP sent at 10/23/2022  2:30 PM EST ----- Darrol Jump may have already sent you a message on [facility]. She needs an ESR and CRP please. Depending on what those show and any future symptoms we will consider need for enterography to further evaluation recent small bowel findings on CT scan. Thanks

## 2022-10-25 NOTE — Telephone Encounter (Signed)
Orders placed. Left message for pt to call back.  

## 2022-10-26 ENCOUNTER — Telehealth: Payer: Self-pay | Admitting: Nurse Practitioner

## 2022-10-26 ENCOUNTER — Other Ambulatory Visit (INDEPENDENT_AMBULATORY_CARE_PROVIDER_SITE_OTHER): Payer: BC Managed Care – PPO

## 2022-10-26 DIAGNOSIS — K5909 Other constipation: Secondary | ICD-10-CM | POA: Diagnosis not present

## 2022-10-26 DIAGNOSIS — R1031 Right lower quadrant pain: Secondary | ICD-10-CM

## 2022-10-26 DIAGNOSIS — R9389 Abnormal findings on diagnostic imaging of other specified body structures: Secondary | ICD-10-CM | POA: Diagnosis not present

## 2022-10-26 LAB — C-REACTIVE PROTEIN: CRP: <1 mg/dL (ref 0.5–20.0)

## 2022-10-26 LAB — SEDIMENTATION RATE: Sed Rate: 11 mm/hr (ref 0–20)

## 2022-10-26 NOTE — Telephone Encounter (Signed)
Patient is returning call. Requesting a call back. ?

## 2022-10-26 NOTE — Telephone Encounter (Signed)
Spoke with pt and let her know about lab work. Pt verbalized understanding and stated she would be by this afternoon to get labs done.

## 2022-10-26 NOTE — Telephone Encounter (Signed)
Spoke with pt. See alternate telephone note.

## 2022-11-01 DIAGNOSIS — R21 Rash and other nonspecific skin eruption: Secondary | ICD-10-CM | POA: Diagnosis not present

## 2022-11-17 ENCOUNTER — Ambulatory Visit: Payer: BC Managed Care – PPO | Admitting: Nurse Practitioner

## 2023-06-29 ENCOUNTER — Ambulatory Visit: Payer: BC Managed Care – PPO | Admitting: Adult Health

## 2023-06-29 ENCOUNTER — Encounter: Payer: Self-pay | Admitting: Adult Health

## 2023-06-29 VITALS — BP 109/78 | HR 73 | Ht 63.0 in | Wt 152.5 lb

## 2023-06-29 DIAGNOSIS — Z7689 Persons encountering health services in other specified circumstances: Secondary | ICD-10-CM | POA: Diagnosis not present

## 2023-06-29 DIAGNOSIS — Z319 Encounter for procreative management, unspecified: Secondary | ICD-10-CM

## 2023-06-29 DIAGNOSIS — Z1331 Encounter for screening for depression: Secondary | ICD-10-CM | POA: Diagnosis not present

## 2023-06-29 NOTE — Progress Notes (Signed)
  Subjective:     Patient ID: Ashlee Schmidt, female   DOB: 01/13/2001, 22 y.o.   MRN: [id number]  HPI Ashlee Schmidt is a 22 year old white female, married, G0P0, in to establish care, she had physical and pap 09/01/22 at [facility]. She is thinking about get pregnant next year and has been off BCP for about 6 months and is using withdrawal.  Last pap was normal 09/01/22.  PCP is Dr Wylene Simmer  Review of Systems Periods are regular Reviewed past medical,surgical, social and family history. Reviewed medications and allergies.     Objective:   Physical Exam BP 109/78 (BP Location: Left Arm, Patient Position: Sitting, Cuff Size: Normal)   Pulse 73   Ht 5\' 3"  (1.6 m)   Wt 152 lb 8 oz (69.2 kg)   LMP 06/16/2023 (Approximate)   BMI 27.01 kg/m     Skin warm and dry. Lungs: clear to ausculation bilaterally. Cardiovascular: regular rate and rhythm.   AA is 3 Fall risk I slow    06/29/2023    9:46 AM 11/16/2017    2:57 PM  Depression screen PHQ 2/9  Decreased Interest 0 0  Down, Depressed, Hopeless 0 0  PHQ - 2 Score 0 0  Altered sleeping 1 0  Tired, decreased energy 2 0  Change in appetite 0 0  Feeling bad or failure about yourself  0 0  Trouble concentrating 1 0  Moving slowly or fidgety/restless 1 0  Suicidal thoughts 0 0  PHQ-9 Score 5 0  Difficult doing work/chores  Not difficult at all       06/29/2023    9:46 AM  GAD 7 : Generalized Anxiety Score  Nervous, Anxious, on Edge 1  Control/stop worrying 0  Worry too much - different things 1  Trouble relaxing 1  Restless 0  Easily annoyed or irritable 2  Afraid - awful might happen 1  Total GAD 7 Score 6    Upstream - 06/29/23 0936       Pregnancy Intention Screening   Does the patient want to become pregnant in the next year? Ok Either Way    Does the patient's partner want to become pregnant in the next year? Ok Either Way    Would the patient like to discuss contraceptive options today? No      Contraception Wrap Up    Current Method Withdrawal or Other Method    End Method Withdrawal or Other Method               Assessment:     1. Encounter to establish care  2. Patient desires pregnancy Discussed could take 6-18 months of active trying to get pregnant Take OTC PNV with folic acid starting now Discussed timing of sex  Continue withdrawal for now     Plan:     Follow up prn

## 2023-08-07 DIAGNOSIS — G5601 Carpal tunnel syndrome, right upper limb: Secondary | ICD-10-CM | POA: Diagnosis not present

## 2023-09-10 DIAGNOSIS — G5601 Carpal tunnel syndrome, right upper limb: Secondary | ICD-10-CM | POA: Diagnosis not present

## 2023-09-21 DIAGNOSIS — G5601 Carpal tunnel syndrome, right upper limb: Secondary | ICD-10-CM | POA: Diagnosis not present

## 2024-01-02 DIAGNOSIS — Z0000 Encounter for general adult medical examination without abnormal findings: Secondary | ICD-10-CM | POA: Diagnosis not present

## 2024-01-09 DIAGNOSIS — Z1339 Encounter for screening examination for other mental health and behavioral disorders: Secondary | ICD-10-CM | POA: Diagnosis not present

## 2024-01-09 DIAGNOSIS — R82998 Other abnormal findings in urine: Secondary | ICD-10-CM | POA: Diagnosis not present

## 2024-01-09 DIAGNOSIS — Z0000 Encounter for general adult medical examination without abnormal findings: Secondary | ICD-10-CM | POA: Diagnosis not present

## 2024-01-09 DIAGNOSIS — Z1331 Encounter for screening for depression: Secondary | ICD-10-CM | POA: Diagnosis not present

## 2024-07-02 ENCOUNTER — Ambulatory Visit (INDEPENDENT_AMBULATORY_CARE_PROVIDER_SITE_OTHER)

## 2024-07-02 VITALS — BP 108/83 | HR 97 | Ht 63.0 in | Wt 154.5 lb

## 2024-07-02 DIAGNOSIS — Z3201 Encounter for pregnancy test, result positive: Secondary | ICD-10-CM | POA: Diagnosis not present

## 2024-07-02 LAB — POCT URINE PREGNANCY: Preg Test, Ur: POSITIVE — AB

## 2024-07-02 MED ORDER — DOXYLAMINE-PYRIDOXINE 10-10 MG PO TBEC: DELAYED_RELEASE_TABLET | ORAL | 6 refills | Status: AC

## 2024-07-02 NOTE — Addendum Note (Signed)
 Addended by: KIZZIE SUZEN SAUNDERS on: 07/02/2024 09:18 AM   Modules accepted: Orders

## 2024-07-02 NOTE — Progress Notes (Signed)
   NURSE VISIT- PREGNANCY CONFIRMATION   SUBJECTIVE:  Ashlee Schmidt is a 23 y.o. G1P0000 female at [redacted]w[redacted]d by certain LMP of Patient's last menstrual period was 06/01/2024 (exact date). Here for pregnancy confirmation.  Home pregnancy test: positive x 3  She reports nausea.  She is taking prenatal vitamins.    OBJECTIVE:  BP 108/83 (BP Location: Right Arm, Patient Position: Sitting, Cuff Size: Normal)   Pulse 97   Ht 5' 3 (1.6 m)   Wt 154 lb 8 oz (70.1 kg)   LMP 06/01/2024 (Exact Date)   BMI 27.37 kg/m   Appears well, in no apparent distress  No results found for this or any previous visit (from the past 24 hours).  ASSESSMENT: Positive pregnancy test, [redacted]w[redacted]d by LMP    PLAN: Schedule for dating ultrasound in 4 weeks Prenatal vitamins: continue   Nausea medicines: requested-note routed to Ashlee Schmidt CNM to send prescription   OB packet given: Yes  Ashlee Schmidt  07/02/2024 8:46 AM

## 2024-07-31 ENCOUNTER — Other Ambulatory Visit: Payer: Self-pay | Admitting: Obstetrics & Gynecology

## 2024-07-31 DIAGNOSIS — O3680X0 Pregnancy with inconclusive fetal viability, not applicable or unspecified: Secondary | ICD-10-CM

## 2024-08-01 ENCOUNTER — Other Ambulatory Visit

## 2024-08-01 DIAGNOSIS — O3680X0 Pregnancy with inconclusive fetal viability, not applicable or unspecified: Secondary | ICD-10-CM | POA: Diagnosis not present

## 2024-08-01 DIAGNOSIS — Z3A08 8 weeks gestation of pregnancy: Secondary | ICD-10-CM | POA: Diagnosis not present

## 2024-08-01 NOTE — Progress Notes (Signed)
 US  8+5 wks,single IUP with yolk sac,FHR 171 bpm,normal ovaries,CRL 24.31 mm

## 2024-08-25 ENCOUNTER — Encounter: Payer: Self-pay | Admitting: Advanced Practice Midwife

## 2024-08-25 ENCOUNTER — Ambulatory Visit: Admitting: *Deleted

## 2024-08-25 ENCOUNTER — Ambulatory Visit: Admitting: Advanced Practice Midwife

## 2024-08-25 ENCOUNTER — Other Ambulatory Visit (HOSPITAL_COMMUNITY)
Admission: RE | Admit: 2024-08-25 | Discharge: 2024-08-25 | Disposition: A | Source: Ambulatory Visit | Attending: Advanced Practice Midwife | Admitting: Advanced Practice Midwife

## 2024-08-25 VITALS — BP 120/81 | HR 81 | Wt 165.0 lb

## 2024-08-25 DIAGNOSIS — Z3400 Encounter for supervision of normal first pregnancy, unspecified trimester: Secondary | ICD-10-CM | POA: Diagnosis not present

## 2024-08-25 DIAGNOSIS — Z3A12 12 weeks gestation of pregnancy: Secondary | ICD-10-CM | POA: Diagnosis not present

## 2024-08-25 DIAGNOSIS — Z1332 Encounter for screening for maternal depression: Secondary | ICD-10-CM | POA: Diagnosis not present

## 2024-08-25 DIAGNOSIS — Z131 Encounter for screening for diabetes mellitus: Secondary | ICD-10-CM

## 2024-08-25 DIAGNOSIS — Z3401 Encounter for supervision of normal first pregnancy, first trimester: Secondary | ICD-10-CM | POA: Diagnosis not present

## 2024-08-25 DIAGNOSIS — Z363 Encounter for antenatal screening for malformations: Secondary | ICD-10-CM | POA: Diagnosis not present

## 2024-08-25 DIAGNOSIS — Z31430 Encounter of female for testing for genetic disease carrier status for procreative management: Secondary | ICD-10-CM

## 2024-08-25 MED ORDER — ONDANSETRON 4 MG PO TBDP: 4.0000 mg | ORAL_TABLET | Freq: Four times a day (QID) | ORAL | 2 refills | Status: AC | PRN

## 2024-08-25 NOTE — Progress Notes (Unsigned)
 INITIAL OBSTETRICAL VISIT Patient name: Ashlee Schmidt MRN [id number]  Date of birth: [birth year] Chief Complaint:   Initial Prenatal Visit  History of Present Illness:   Ashlee Schmidt is a 23 y.o. G73P0000 Caucasian female at [redacted]w[redacted]d by LMP c/w u/s at 9 weeks with an Estimated Date of Delivery: 03/08/25 being seen today for her initial obstetrical visit.   Her obstetrical history is significant for first baby.   Today she reports no complaints. Nausea from time to time, B6 makes her sleepy the next day.    08/25/2024    1:28 PM 06/29/2023    9:46 AM 11/16/2017    2:57 PM  Depression screen PHQ 2/9  Decreased Interest 0 0 0  Down, Depressed, Hopeless 0 0 0  PHQ - 2 Score 0 0 0  Altered sleeping 1 1 0  Tired, decreased energy 1 2 0  Change in appetite 0 0 0  Feeling bad or failure about yourself  0 0 0  Trouble concentrating 0 1 0  Moving slowly or fidgety/restless 0 1 0  Suicidal thoughts 0 0 0  PHQ-9 Score 2 5  0   Difficult doing work/chores   Not difficult at all     Data saved with a previous flowsheet row definition    Patient's last menstrual period was 06/01/2024 (exact date). Last pap 09/01/22 Review of Systems:   Pertinent items are noted in HPI Denies cramping/contractions, leakage of fluid, vaginal bleeding, abnormal vaginal discharge w/ itching/odor/irritation, headaches, visual changes, shortness of breath, chest pain, abdominal pain, severe nausea/vomiting, or problems with urination or bowel movements unless otherwise stated above.  Pertinent History Reviewed:  Reviewed past medical,surgical, social, obstetrical and family history.  Reviewed problem list, medications and allergies. OB History  Gravida Para Term Preterm AB Living  1 0 0 0 0 0  SAB IAB Ectopic Multiple Live Births  0 0 0 0 0    # Outcome Date GA Lbr Len/2nd Weight Sex Type Anes PTL Lv  1 Current            Physical Assessment:   Vitals:   08/25/24 1304  BP: 120/81  Pulse: 81  Weight: 165 lb  (74.8 kg)  Body mass index is 29.23 kg/m.       Physical Examination:  General appearance - well appearing, and in no distress  Mental status - alert, oriented to person, place, and time  Psych:  She has a normal mood and affect  Skin - warm and dry, normal color, no suspicious lesions noted  Chest - effort normal  Heart - normal rate and regular rhythm  Abdomen - soft, nontender  Extremities:  No swelling or varicosities noted    TODAY'S US :  BS US  160   No results found for this or any previous visit (from the past 24 hours).   Indications for ASA therapy (per uptodate)  Two or more of the following: Nulliparity Yes Obesity (body mass index >30 kg/m2) No Family history of preeclampsia in mother or sister {yes/no:20286}       08/25/2024    1:28 PM 06/29/2023    9:46 AM 11/16/2017    2:57 PM  Depression screen PHQ 2/9  Decreased Interest 0 0 0  Down, Depressed, Hopeless 0 0 0  PHQ - 2 Score 0 0 0  Altered sleeping 1 1 0  Tired, decreased energy 1 2 0  Change in appetite 0 0 0  Feeling bad or failure about yourself  0 0 0  Trouble concentrating 0 1 0  Moving slowly or fidgety/restless 0 1 0  Suicidal thoughts 0 0 0  PHQ-9 Score 2 5  0   Difficult doing work/chores   Not difficult at all     Data saved with a previous flowsheet row definition        08/25/2024    1:28 PM 06/29/2023    9:46 AM  GAD 7 : Generalized Anxiety Score  Nervous, Anxious, on Edge 0 1  Control/stop worrying 0 0  Worry too much - different things 0 1  Trouble relaxing 0 1  Restless 0 0  Easily annoyed or irritable 0 2  Afraid - awful might happen 0 1  Total GAD 7 Score 0 6      Mental Health score normal Yes Follow up: N/A   Assessment & Plan:  1) Low-Risk Pregnancy G1P0000 at [redacted]w[redacted]d with an Estimated Date of Delivery: 03/08/25   2) Initial OB visit    1. Supervision of normal first pregnancy, antepartum (Primary)   2. [redacted] weeks gestation of pregnancy   3. Screening for  diabetes mellitus        Meds:  Meds ordered this encounter  Medications   ondansetron  (ZOFRAN -ODT) 4 MG disintegrating tablet    Sig: Take 1 tablet (4 mg total) by mouth every 6 (six) hours as needed for nausea.    Dispense:  30 tablet    Refill:  2    Supervising Provider:   JAYNE MINDER H [[id number]]    Initial labs obtained Continue prenatal vitamins Reviewed n/v relief measures and warning s/s to report Reviewed recommended weight gain based on pre-gravid BMI Encouraged well-balanced diet Genetic & carrier screening discussed: requests Panorama, AFP, and Horizon ,   Ultrasound discussed; fetal survey: requested CCNC completed> form faxed if has or is planning to apply for medicaid The nature of [facility] [employer] with multiple MDs and other Advanced Practice Providers was explained to patient; also emphasized that fellows, residents, and students are part of our team. Has a home bp cuff.. Check bp weekly, let us  know if >140/90.        Cathlean Cresenzo-Dishmon 4:09 PM

## 2024-08-25 NOTE — Patient Instructions (Signed)
 Maybree L Defrank, I greatly value your feedback.  If you receive a survey following your visit with us  today, we appreciate you taking the time to fill it out.  Thanks, Sherrell Ely, DNP, CNM  [facility] HAS MOVED!!! It is now [facility] at [facility] ([address] [place], [place] [address]) Entrance located off of [address] Free 24/7 valet parking   Nausea & Vomiting Have saltine crackers or pretzels by your bed and eat a few bites before you raise your head out of bed in the morning Eat small frequent meals throughout the day instead of large meals Drink plenty of fluids throughout the day to stay hydrated, just don't drink a lot of fluids with your meals.  This can make your stomach fill up faster making you feel sick Do not brush your teeth right after you eat Products with real ginger are good for nausea, like ginger ale and ginger hard candy Make sure it says made with real ginger! Sucking on sour candy like lemon heads is also good for nausea If your prenatal vitamins make you nauseated, take them at night so you will sleep through the nausea Sea Bands If you feel like you need medicine for the nausea & vomiting please let us  know If you are unable to keep any fluids or food down please let us  know   Constipation Drink plenty of fluid, preferably water, throughout the day Eat foods high in fiber such as fruits, vegetables, and grains Exercise, such as walking, is a good way to keep your bowels regular Drink warm fluids, especially warm prune juice, or decaf coffee Eat a 1/2 cup of real oatmeal (not instant), 1/2 cup applesauce, and 1/2-1 cup warm prune juice every day If needed, you may take Colace (docusate sodium) stool softener once or twice a day to help keep the stool soft.  If you still are having problems with constipation, you may take Miralax  once daily as needed to help keep your bowels regular.   Home Blood Pressure Monitoring for  Patients   Your provider has recommended that you check your blood pressure (BP) at least once a week at home. If you do not have a blood pressure cuff at home, one will be provided for you. Contact your provider if you have not received your monitor within 1 week.   Helpful Tips for Accurate Home Blood Pressure Checks  Don't smoke, exercise, or drink caffeine 30 minutes before checking your BP Use the restroom before checking your BP (a full bladder can raise your pressure) Relax in a comfortable upright chair Feet on the ground Left arm resting comfortably on a flat surface at the level of your heart Legs uncrossed Back supported Sit quietly and don't talk Place the cuff on your bare arm Adjust snuggly, so that only two fingertips can fit between your skin and the top of the cuff Check 2 readings separated by at least one minute Keep a log of your BP readings For a visual, please reference this diagram: http://ccnc.care/bpdiagram  Provider Name: [facility]     Phone: [phone]  Zone 1: ALL CLEAR  Continue to monitor your symptoms:  BP reading is less than 140 (top number) or less than 90 (bottom number)  No right upper stomach pain No headaches or seeing spots No feeling nauseated or throwing up No swelling in face and hands  Zone 2: CAUTION Call your doctor's office for any of the following:  BP reading is greater than 140 (top number) or greater than 90 (bottom number)  Stomach pain under your ribs in the middle or right side Headaches or seeing spots Feeling nauseated or throwing up Swelling in face and hands  Zone 3: EMERGENCY  Seek immediate medical care if you have any of the following:  BP reading is greater than160 (top number) or greater than 110 (bottom number) Severe headaches not improving with Tylenol  Serious difficulty catching your breath Any worsening symptoms from Zone 2    First Trimester of Pregnancy The first trimester of pregnancy is from  week 1 until the end of week 12 (months 1 through 3). A week after a sperm fertilizes an egg, the egg will implant on the wall of the uterus. This embryo will begin to develop into a baby. Genes from you and your partner are forming the baby. The female genes determine whether the baby is a boy or a girl. At 6-8 weeks, the eyes and face are formed, and the heartbeat can be seen on ultrasound. At the end of 12 weeks, all the baby's organs are formed.  Now that you are pregnant, you will want to do everything you can to have a healthy baby. Two of the most important things are to get good prenatal care and to follow your health care provider's instructions. Prenatal care is all the medical care you receive before the baby's birth. This care will help prevent, find, and treat any problems during the pregnancy and childbirth. BODY CHANGES Your body goes through many changes during pregnancy. The changes vary from woman to woman.  You may gain or lose a couple of pounds at first. You may feel sick to your stomach (nauseous) and throw up (vomit). If the vomiting is uncontrollable, call your health care provider. You may tire easily. You may develop headaches that can be relieved by medicines approved by your health care provider. You may urinate more often. Painful urination may mean you have a bladder infection. You may develop heartburn as a result of your pregnancy. You may develop constipation because certain hormones are causing the muscles that push waste through your intestines to slow down. You may develop hemorrhoids or swollen, bulging veins (varicose veins). Your breasts may begin to grow larger and become tender. Your nipples may stick out more, and the tissue that surrounds them (areola) may become darker. Your gums may bleed and may be sensitive to brushing and flossing. Dark spots or blotches (chloasma, mask of pregnancy) may develop on your face. This will likely fade after the baby is  born. Your menstrual periods will stop. You may have a loss of appetite. You may develop cravings for certain kinds of food. You may have changes in your emotions from day to day, such as being excited to be pregnant or being concerned that something may go wrong with the pregnancy and baby. You may have more vivid and strange dreams. You may have changes in your hair. These can include thickening of your hair, rapid growth, and changes in texture. Some women also have hair loss during or after pregnancy, or hair that feels dry or thin. Your hair will most likely return to normal after your baby is born. WHAT TO EXPECT AT YOUR PRENATAL VISITS During a routine prenatal visit: You will be weighed to make sure you and the baby are growing normally. Your blood pressure will be taken. Your abdomen will be measured to track your baby's growth. The fetal  heartbeat will be listened to starting around week 10 or 12 of your pregnancy. Test results from any previous visits will be discussed. Your health care provider may ask you: How you are feeling. If you are feeling the baby move. If you have had any abnormal symptoms, such as leaking fluid, bleeding, severe headaches, or abdominal cramping. If you have any questions. Other tests that may be performed during your first trimester include: Blood tests to find your blood type and to check for the presence of any previous infections. They will also be used to check for low iron levels (anemia) and Rh antibodies. Later in the pregnancy, blood tests for diabetes will be done along with other tests if problems develop. Urine tests to check for infections, diabetes, or protein in the urine. An ultrasound to confirm the proper growth and development of the baby. An amniocentesis to check for possible genetic problems. Fetal screens for spina bifida and Down syndrome. You may need other tests to make sure you and the baby are doing well. HOME CARE  INSTRUCTIONS  Medicines Follow your health care provider's instructions regarding medicine use. Specific medicines may be either safe or unsafe to take during pregnancy. Take your prenatal vitamins as directed. If you develop constipation, try taking a stool softener if your health care provider approves. Diet Eat regular, well-balanced meals. Choose a variety of foods, such as meat or vegetable-based protein, fish, milk and low-fat dairy products, vegetables, fruits, and whole grain breads and cereals. Your health care provider will help you determine the amount of weight gain that is right for you. Avoid raw meat and uncooked cheese. These carry germs that can cause birth defects in the baby. Eating four or five small meals rather than three large meals a day may help relieve nausea and vomiting. If you start to feel nauseous, eating a few soda crackers can be helpful. Drinking liquids between meals instead of during meals also seems to help nausea and vomiting. If you develop constipation, eat more high-fiber foods, such as fresh vegetables or fruit and whole grains. Drink enough fluids to keep your urine clear or pale yellow. Activity and Exercise Exercise only as directed by your health care provider. Exercising will help you: Control your weight. Stay in shape. Be prepared for labor and delivery. Experiencing pain or cramping in the lower abdomen or low back is a good sign that you should stop exercising. Check with your health care provider before continuing normal exercises. Try to avoid standing for long periods of time. Move your legs often if you must stand in one place for a long time. Avoid heavy lifting. Wear low-heeled shoes, and practice good posture. You may continue to have sex unless your health care provider directs you otherwise. Relief of Pain or Discomfort Wear a good support bra for breast tenderness.   Take warm sitz baths to soothe any pain or discomfort caused by  hemorrhoids. Use hemorrhoid cream if your health care provider approves.   Rest with your legs elevated if you have leg cramps or low back pain. If you develop varicose veins in your legs, wear support hose. Elevate your feet for 15 minutes, 3-4 times a day. Limit salt in your diet. Prenatal Care Schedule your prenatal visits by the twelfth week of pregnancy. They are usually scheduled monthly at first, then more often in the last 2 months before delivery. Write down your questions. Take them to your prenatal visits. Keep all your prenatal visits as directed  by your health care provider. Safety Wear your seat belt at all times when driving. Make a list of emergency phone numbers, including numbers for family, friends, the hospital, and police and fire departments. General Tips Ask your health care provider for a referral to a local prenatal education class. Begin classes no later than at the beginning of month 6 of your pregnancy. Ask for help if you have counseling or nutritional needs during pregnancy. Your health care provider can offer advice or refer you to specialists for help with various needs. Do not use hot tubs, steam rooms, or saunas. Do not douche or use tampons or scented sanitary pads. Do not cross your legs for long periods of time. Avoid cat litter boxes and soil used by cats. These carry germs that can cause birth defects in the baby and possibly loss of the fetus by miscarriage or stillbirth. Avoid all smoking, herbs, alcohol , and medicines not prescribed by your health care provider. Chemicals in these affect the formation and growth of the baby. Schedule a dentist appointment. At home, brush your teeth with a soft toothbrush and be gentle when you floss. SEEK MEDICAL CARE IF:  You have dizziness. You have mild pelvic cramps, pelvic pressure, or nagging pain in the abdominal area. You have persistent nausea, vomiting, or diarrhea. You have a bad smelling vaginal  discharge. You have pain with urination. You notice increased swelling in your face, hands, legs, or ankles. SEEK IMMEDIATE MEDICAL CARE IF:  You have a fever. You are leaking fluid from your vagina. You have spotting or bleeding from your vagina. You have severe abdominal cramping or pain. You have rapid weight gain or loss. You vomit blood or material that looks like coffee grounds. You are exposed to German measles and have never had them. You are exposed to fifth disease or chickenpox. You develop a severe headache. You have shortness of breath. You have any kind of trauma, such as from a fall or a car accident. Document Released: 08/29/2001 Document Revised: 01/19/2014 Document Reviewed: 07/15/2013 [facility] Patient Information 2015 [place], [place]. This information is not intended to replace advice given to you by your health care provider. Make sure you discuss any questions you have with your health care provider.  ADDITIONAL HEALTHCARE OPTIONS FOR PATIENTS  [facility] / e-Visit: [email or web address]         [facility]: [phone]  [facility]: [phone]                   [facility]: [phone]     Safe Medications in Pregnancy   Acne: Benzoyl Peroxide Salicylic Acid  Backache/Headache: Tylenol : 2 regular strength every 4 hours OR              2 Extra strength every 6 hours  Colds/Coughs/Allergies: Benadryl (alcohol  free) 25 mg every 6 hours as needed Breath right strips Claritin Cepacol throat lozenges Chloraseptic throat spray Cold-Eeze- up to three times per day Cough drops, alcohol  free Flonase (by prescription only) Guaifenesin Mucinex Robitussin DM (plain only, alcohol  free) Saline nasal spray/drops Sudafed (pseudoephedrine) & Actifed ** use only after [redacted] weeks gestation and if you do not have high blood pressure Tylenol  Vicks Vaporub Zinc  lozenges Zyrtec   Constipation: Colace Ducolax suppositories Fleet enema Glycerin suppositories Metamucil Milk of magnesia Miralax  Senokot Smooth move tea  Diarrhea: Kaopectate Imodium A-D  *NO pepto Bismol  Hemorrhoids: Anusol Anusol HC Preparation H Tucks  Indigestion: Tums Maalox  Mylanta Zantac  Pepcid  Insomnia: Benadryl (alcohol  free) 25mg  every 6 hours as needed Tylenol  PM Unisom , no Gelcaps  Leg Cramps: Tums MagGel  Nausea/Vomiting:  Bonine Dramamine Emetrol Ginger extract Sea bands Meclizine  Nausea medication to take during pregnancy:  Unisom  (doxylamine  succinate 25 mg tablets) Take one tablet daily at bedtime. If symptoms are not adequately controlled, the dose can be increased to a maximum recommended dose of two tablets daily (1/2 tablet in the morning, 1/2 tablet mid-afternoon and one at bedtime). Vitamin B6 100mg  tablets. Take one tablet twice a day (up to 200 mg per day).  Skin Rashes: Aveeno products Benadryl cream or 25mg  every 6 hours as needed Calamine Lotion 1% cortisone cream  Yeast infection: Gyne-lotrimin 7 Monistat 7   **If taking multiple medications, please check labels to avoid duplicating the same active ingredients **take medication as directed on the label ** Do not exceed 4000 mg of tylenol  in 24 hours **Do not take medications that contain aspirin or ibuprofen    ([phone] is the phone number for Pregnancy Classes or hospital tours at [facility].   You will be referred to  [email or web address]   for more information on childbirth classes   At this site you may register for classes. You may sign up for a waiting list if classes are full. Please SIGN UP FOR THIS!.   When the waiting list becomes long, sometimes new classes can be added.  [facility] Call to Register: [phone] or  [phone]   or   Register Online: [email or web address] THESE CLASSES FILL UP VERY QUICKLY, SO SIGN UP AS SOON AS YOU CAN!!! Please visit [facility] pregnancy website at [email or web address]  Childbirth Classes  Option 1: Birth & Baby Series Series of 3 weekly classes, on the same day of the week (can choose Mon-Thurs) from 6-9pm Helps you and your support person prepare for childbirth Reviews newborn care, labor & birth, cesarean birth, pain management, and comfort techniques Cost: $60 per couple for insured or self-pay, $30 per couple for Medicaid  Option 2: Weekend Birth & Baby This class is a weekend version of our Birth & Baby series.  It is designed for parents who have a difficult time fitting several weeks of classes into their schedule.   Covers the care of your newborn and the basics of labor and childbirth Friday 6:30pm-8:30pm Saturday 9am-4pm, includes lunch for you and your partner  Cost: $75 per couple for insured or self-pay, $30 per couple for Medicaid  Option 3: Natural Childbirth This series of 5 weekly classes is for expectant parents who want to learn and practice natural methods of coping with the process of labor and childbirth.  Can choose Mon or Tues, 7-9pm.   Covers relaxation, breathing, massage, visualization, role of the partner, and helpful positioning Participants learn how to be confident in their body's ability to give birth. Class empowers and helps parents make informed decisions about care. Includes discussion that will help new parents transition into the immediate postpartum period.  Cost: $75 per couple for insured or self-pay, $30 per couple for Medicaid  Option 4: Online Birth & Baby This online class offers you the freedom to complete a Birth & Baby series in the comfort of your own home.  The flexibility of this option allows you to review sections at your own pace, at times convenient to you and your support people.  It includes additional  video information, animations, quizzes and extended activities. Get  organized with helpful eClass tools, checklists, and trackers.  Cost: $60 for 60 days of online access                                                                            Other Available Classes  Baby & Me Enjoy this time to discuss newborn & infant parenting topics and family adjustment issues with other new mothers in a relaxed environment. Each week brings a new speaker or baby-centered activity. We encourage mothers and their babies (birth to crawling) to join us . You are welcome to visit this group even if you haven't delivered yet! It's wonderful to make new friends early and watch other moms interact with their babies. No registration or fee.  Big Brother/Big Sister Let your children share in the joy of a new brother or sister in this special class designed just for them. Discussion includes how families care for babies: swaddling, holding, diapering, safety, as well as how they can be helpful in their new role. This class is designed for children ages 2 to 79, but any age is welcome. Please register each child individually. $5 Breastfeeding Support Group This group is a mother-to-mother support circle where moms have the opportunity to share their breastfeeding experiences. A Breastfeeding Support nurse is present for questions and concerns. An infant scale is available for weight checks. No fee or registration.  Breastfeeding Your Baby Breastfeeding is a special time for mother and child. This class will help you feel ready to begin this important relationship. Your partner is encouraged to attend with you. Learn what to expect and feel more confident in the first days of breastfeeding your newborn. This class also addresses the most common fears and challenges of breastfeeding during the first few weeks, months, and beyond. $30 per couple Caring for Baby This class is for expectant and adoptive parents who want to  learn and practice the most up-to-date newborn care for their babies. Focus is on birth through first six weeks of life. Topics include feeding, bathing, diapering, crying, umbilical cord care, circumcision care and safe sleep. Parents learn how to recognize symptoms of illness and when to call the pediatrician. Register only the mom-to-be and your partner can plan to come with you. (*Note: This class is included in the Birth & Baby series and the Weekend Birth & Baby classes.) $10 per couple Comfort Techniques & Tour This 2-hour interactive class is designed for those who either do not wish to take the Birth & Baby series or for those who prefer our online childbirth class, but don't want to miss the opportunity to learn and practice hands-on techniques. These skills can help relieve some of the discomfort of labor and encourage your baby to rotate toward the best position for birth. You and your partner will be able to try a variety of labor positions with birth balls and rebozos as well as practice breathing, relaxation, and visual techniques. $20 per couple [employer] [facility] This course offers Dads-to-be the tools and knowledge needed to feel confident on their journey to becoming new fathers. Experienced dads, who have been trained as coaches, teach dads-to-be how to hold, comfort, diapers, swaddle and play with their infant while being  able to support the new mom as well. $25 Grandparent Love Expecting a grandbaby? Learn about the latest infant care and safety recommendations and ways to support your own child as he or she transitions into the parenting role. $10 per person Infant and Child CPR Parents, grandparents, babysitters, and friends learn Cardio-Pulmonary Resuscitation skills for infants and children. You will also learn how to treat both conscious and unconscious choking infants and children. Register each participant individually. (Note: This Family & Friends program does not offer  certification.) $20 per person Marvelous Multiples Expecting twins, triplets, or more? This free 2-hour class covers the differences in labor, birth, parenting, and breastfeeding issues that face multiples' parents.  Maternity Care Center Virtual Tour  Online virtual tour of the new [place] [facility] at [facility] Talk This free mom-led group offers support and connection to mothers as they journey through the adjustments and struggles of that sometimes overwhelming first year after the birth of a child. A member of our staff will be present to share resources and additional support if needed, as you care for yourself and baby. You are welcome to visit this group before you deliver! It's wonderful to meet new friends early and watch other moms interact with their babies.  Waterbirth Class Interested in a waterbirth? This free informational class will help you discover whether waterbirth is the right fit for you and is required if you are planning a waterbirth. Education about waterbirth itself, supplies you may need, and what you may need from your support team is included in this class. Partners are encouraged to come.

## 2024-08-26 ENCOUNTER — Ambulatory Visit: Payer: Self-pay | Admitting: Advanced Practice Midwife

## 2024-08-26 LAB — CBC/D/PLT+RPR+RH+ABO+RUBIGG...
Antibody Screen: NEGATIVE
Basophils Absolute: 0 x10E3/uL (ref 0.0–0.2)
Basos: 0 %
EOS (ABSOLUTE): 0.1 x10E3/uL (ref 0.0–0.4)
Eos: 1 %
HCV Ab: NONREACTIVE
HIV Screen 4th Generation wRfx: NONREACTIVE
Hematocrit: 41.8 % (ref 34.0–46.6)
Hemoglobin: 14 g/dL (ref 11.1–15.9)
Hepatitis B Surface Ag: NEGATIVE
Immature Grans (Abs): 0 x10E3/uL (ref 0.0–0.1)
Immature Granulocytes: 0 %
Lymphocytes Absolute: 2.4 x10E3/uL (ref 0.7–3.1)
Lymphs: 24 %
MCH: 29.3 pg (ref 26.6–33.0)
MCHC: 33.5 g/dL (ref 31.5–35.7)
MCV: 87 fL (ref 79–97)
Monocytes Absolute: 0.7 x10E3/uL (ref 0.1–0.9)
Monocytes: 7 %
Neutrophils Absolute: 7.1 x10E3/uL — ABNORMAL HIGH (ref 1.4–7.0)
Neutrophils: 68 %
Platelets: 306 x10E3/uL (ref 150–450)
RBC: 4.78 x10E6/uL (ref 3.77–5.28)
RDW: 12.8 % (ref 11.7–15.4)
RPR Ser Ql: NONREACTIVE
Rh Factor: POSITIVE
Rubella Antibodies, IGG: 3.23 {index} (ref >0.99)
WBC: 10.3 x10E3/uL (ref 3.4–10.8)

## 2024-08-26 LAB — CERVICOVAGINAL ANCILLARY ONLY
Chlamydia: NEGATIVE
Comment: NEGATIVE
Comment: NORMAL
Neisseria Gonorrhea: NEGATIVE

## 2024-08-26 LAB — HCV INTERPRETATION

## 2024-08-26 LAB — HEMOGLOBIN A1C
Est. average glucose Bld gHb Est-mCnc: 100 mg/dL
Hgb A1c MFr Bld: 5.1 % (ref 4.8–5.6)

## 2024-08-27 LAB — URINE CULTURE

## 2024-08-31 LAB — PANORAMA PRENATAL TEST FULL PANEL:PANORAMA TEST PLUS 5 ADDITIONAL MICRODELETIONS: FETAL FRACTION: 7.2

## 2024-09-03 LAB — HORIZON CUSTOM: REPORT SUMMARY: NEGATIVE

## 2024-09-17 ENCOUNTER — Encounter: Payer: Self-pay | Admitting: Advanced Practice Midwife

## 2024-09-22 ENCOUNTER — Other Ambulatory Visit (HOSPITAL_COMMUNITY)
Admission: RE | Admit: 2024-09-22 | Discharge: 2024-09-22 | Disposition: A | Discharge status: Home / Self Care | Source: Ambulatory Visit | Attending: Advanced Practice Midwife | Admitting: Advanced Practice Midwife

## 2024-09-22 ENCOUNTER — Encounter: Payer: Self-pay | Admitting: Advanced Practice Midwife

## 2024-09-22 ENCOUNTER — Ambulatory Visit (INDEPENDENT_AMBULATORY_CARE_PROVIDER_SITE_OTHER): Admitting: Advanced Practice Midwife

## 2024-09-22 VITALS — BP 122/73 | HR 88 | Wt 174.0 lb

## 2024-09-22 DIAGNOSIS — Z3402 Encounter for supervision of normal first pregnancy, second trimester: Secondary | ICD-10-CM

## 2024-09-22 DIAGNOSIS — Z1151 Encounter for screening for human papillomavirus (HPV): Secondary | ICD-10-CM | POA: Insufficient documentation

## 2024-09-22 DIAGNOSIS — Z3400 Encounter for supervision of normal first pregnancy, unspecified trimester: Secondary | ICD-10-CM | POA: Diagnosis present

## 2024-09-22 DIAGNOSIS — Z124 Encounter for screening for malignant neoplasm of cervix: Secondary | ICD-10-CM

## 2024-09-22 DIAGNOSIS — Z3A16 16 weeks gestation of pregnancy: Secondary | ICD-10-CM

## 2024-09-22 DIAGNOSIS — Z3482 Encounter for supervision of other normal pregnancy, second trimester: Secondary | ICD-10-CM | POA: Diagnosis not present

## 2024-09-22 NOTE — Patient Instructions (Signed)
 Adin L Armentor, I greatly value your feedback.  If you receive a survey following your visit with us  today, we appreciate you taking the time to fill it out.  Thanks, Sherrell Ely, CNM     [facility] HAS MOVED!!! It is now [facility] ([address] [place], [place] [address]) Entrance located off of [address] Free 24/7 valet parking   Go to [email or web address] to register for FREE online childbirth classes    Second Trimester of Pregnancy The second trimester is from week 14 through week 27 (months 4 through 6). The second trimester is often a time when you feel your best. Your body has adjusted to being pregnant, and you begin to feel better physically. Usually, morning sickness has lessened or quit completely, you may have more energy, and you may have an increase in appetite. The second trimester is also a time when the fetus is growing rapidly. At the end of the sixth month, the fetus is about 9 inches long and weighs about 1 pounds. You will likely begin to feel the baby move (quickening) between 16 and 20 weeks of pregnancy. Body changes during your second trimester Your body continues to go through many changes during your second trimester. The changes vary from woman to woman. Your weight will continue to increase. You will notice your lower abdomen bulging out. You may begin to get stretch marks on your hips, abdomen, and breasts. You may develop headaches that can be relieved by medicines. The medicines should be approved by your health care provider. You may urinate more often because the fetus is pressing on your bladder. You may develop or continue to have heartburn as a result of your pregnancy. You may develop constipation because certain hormones are causing the muscles that push waste through your intestines to slow down. You may develop hemorrhoids or swollen, bulging veins (varicose veins). You may have back pain. This is  caused by: Weight gain. Pregnancy hormones that are relaxing the joints in your pelvis. A shift in weight and the muscles that support your balance. Your breasts will continue to grow and they will continue to become tender. Your gums may bleed and may be sensitive to brushing and flossing. Dark spots or blotches (chloasma, mask of pregnancy) may develop on your face. This will likely fade after the baby is born. A dark line from your belly button to the pubic area (linea nigra) may appear. This will likely fade after the baby is born. You may have changes in your hair. These can include thickening of your hair, rapid growth, and changes in texture. Some women also have hair loss during or after pregnancy, or hair that feels dry or thin. Your hair will most likely return to normal after your baby is born.  What to expect at prenatal visits During a routine prenatal visit: You will be weighed to make sure you and the fetus are growing normally. Your blood pressure will be taken. Your abdomen will be measured to track your baby's growth. The fetal heartbeat will be listened to. Any test results from the previous visit will be discussed.  Your health care provider may ask you: How you are feeling. If you are feeling the baby move. If you have had any abnormal symptoms, such as leaking fluid, bleeding, severe headaches, or abdominal cramping. If you are using any tobacco products, including cigarettes, chewing tobacco, and electronic cigarettes. If you have any questions.  Other tests  that may be performed during your second trimester include: Blood tests that check for: Low iron levels (anemia). High blood sugar that affects pregnant women (gestational diabetes) between 33 and 28 weeks. Rh antibodies. This is to check for a protein on red blood cells (Rh factor). Urine tests to check for infections, diabetes, or protein in the urine. An ultrasound to confirm the proper growth and  development of the baby. An amniocentesis to check for possible genetic problems. Fetal screens for spina bifida and Down syndrome. HIV (human immunodeficiency virus) testing. Routine prenatal testing includes screening for HIV, unless you choose not to have this test.  Follow these instructions at home: Medicines Follow your health care provider's instructions regarding medicine use. Specific medicines may be either safe or unsafe to take during pregnancy. Take a prenatal vitamin that contains at least 600 micrograms (mcg) of folic acid. If you develop constipation, try taking a stool softener if your health care provider approves. Eating and drinking Eat a balanced diet that includes fresh fruits and vegetables, whole grains, good sources of protein such as meat, eggs, or tofu, and low-fat dairy. Your health care provider will help you determine the amount of weight gain that is right for you. Avoid raw meat and uncooked cheese. These carry germs that can cause birth defects in the baby. If you have low calcium intake from food, talk to your health care provider about whether you should take a daily calcium supplement. Limit foods that are high in fat and processed sugars, such as fried and sweet foods. To prevent constipation: Drink enough fluid to keep your urine clear or pale yellow. Eat foods that are high in fiber, such as fresh fruits and vegetables, whole grains, and beans. Activity Exercise only as directed by your health care provider. Most women can continue their usual exercise routine during pregnancy. Try to exercise for 30 minutes at least 5 days a week. Stop exercising if you experience uterine contractions. Avoid heavy lifting, wear low heel shoes, and practice good posture. A sexual relationship may be continued unless your health care provider directs you otherwise. Relieving pain and discomfort Wear a good support bra to prevent discomfort from breast tenderness. Take  warm sitz baths to soothe any pain or discomfort caused by hemorrhoids. Use hemorrhoid cream if your health care provider approves. Rest with your legs elevated if you have leg cramps or low back pain. If you develop varicose veins, wear support hose. Elevate your feet for 15 minutes, 3-4 times a day. Limit salt in your diet. Prenatal Care Write down your questions. Take them to your prenatal visits. Keep all your prenatal visits as told by your health care provider. This is important. Safety Wear your seat belt at all times when driving. Make a list of emergency phone numbers, including numbers for family, friends, the hospital, and police and fire departments. General instructions Ask your health care provider for a referral to a local prenatal education class. Begin classes no later than the beginning of month 6 of your pregnancy. Ask for help if you have counseling or nutritional needs during pregnancy. Your health care provider can offer advice or refer you to specialists for help with various needs. Do not use hot tubs, steam rooms, or saunas. Do not douche or use tampons or scented sanitary pads. Do not cross your legs for long periods of time. Avoid cat litter boxes and soil used by cats. These carry germs that can cause birth defects in the baby  and possibly loss of the fetus by miscarriage or stillbirth. Avoid all smoking, herbs, alcohol , and unprescribed drugs. Chemicals in these products can affect the formation and growth of the baby. Do not use any products that contain nicotine or tobacco, such as cigarettes and e-cigarettes. If you need help quitting, ask your health care provider. Visit your dentist if you have not gone yet during your pregnancy. Use a soft toothbrush to brush your teeth and be gentle when you floss. Contact a health care provider if: You have dizziness. You have mild pelvic cramps, pelvic pressure, or nagging pain in the abdominal area. You have persistent  nausea, vomiting, or diarrhea. You have a bad smelling vaginal discharge. You have pain when you urinate. Get help right away if: You have a fever. You are leaking fluid from your vagina. You have spotting or bleeding from your vagina. You have severe abdominal cramping or pain. You have rapid weight gain or weight loss. You have shortness of breath with chest pain. You notice sudden or extreme swelling of your face, hands, ankles, feet, or legs. You have not felt your baby move in over an hour. You have severe headaches that do not go away when you take medicine. You have vision changes. Summary The second trimester is from week 14 through week 27 (months 4 through 6). It is also a time when the fetus is growing rapidly. Your body goes through many changes during pregnancy. The changes vary from woman to woman. Avoid all smoking, herbs, alcohol , and unprescribed drugs. These chemicals affect the formation and growth your baby. Do not use any tobacco products, such as cigarettes, chewing tobacco, and e-cigarettes. If you need help quitting, ask your health care provider. Contact your health care provider if you have any questions. Keep all prenatal visits as told by your health care provider. This is important. This information is not intended to replace advice given to you by your health care provider. Make sure you discuss any questions you have with your health care provider.

## 2024-09-22 NOTE — Progress Notes (Signed)
" ° °  LOW-RISK PREGNANCY VISIT Patient name: Ashlee Schmidt MRN [id number]  Date of birth: 07/27/2001 Chief Complaint:   Routine Prenatal Visit  History of Present Illness:   Ashlee Schmidt is a 24 y.o. G52P0000 female at [redacted]w[redacted]d with an Estimated Date of Delivery: 03/08/25 being seen today for ongoing management of a low-risk pregnancy.  Today she reports no complaints. Contractions: Not present. Vag. Bleeding: None.  Movement: Absent. denies leaking of fluid. Review of Systems:   Pertinent items are noted in HPI Denies abnormal vaginal discharge w/ itching/odor/irritation, headaches, visual changes, shortness of breath, chest pain, abdominal pain, severe nausea/vomiting, or problems with urination or bowel movements unless otherwise stated above. Pertinent History Reviewed:  Reviewed past medical,surgical, social, obstetrical and family history.  Reviewed problem list, medications and allergies.    Physical Assessment:     Vitals:   09/22/24 1402  BP: 122/73  Pulse: 88  Weight: 174 lb (78.9 kg)  Body mass index is 30.82 kg/m.        Physical Examination:   General appearance: Well appearing, and in no distress  Mental status: Alert, oriented to person, place, and time  Skin: Warm & dry  Cardiovascular: Normal heart rate noted  Respiratory: Normal respiratory effort, no distress  Abdomen: Soft, gravid, nontender  Pelvic: normal appearing dc, cx          Extremities: Edema: None Chaperone:  Latisha Cresenzo   Fetal Status: Fetal Heart Rate (bpm): 160   Movement: Absent      No results found for this or any previous visit (from the past 24 hours).  Assessment & Plan:    Pregnancy: G1P0000 at [redacted]w[redacted]d 1. [redacted] weeks gestation of pregnancy (Primary)  - Cytology - PAP - AFP, Serum, Open Spina Bifida  2. Screening for cervical cancer  - Cytology - PAP  3. Supervision of normal first pregnancy, antepartum  - Cytology - PAP - AFP, Serum, Open Spina Bifida     Meds: No orders of  the defined types were placed in this encounter.  Labs/procedures today: pap  Plan:  Continue routine obstetrical care  Next visit: prefers will be in person for anatomy scan    Reviewed:  general obstetric precautions including but not limited to vaginal bleeding, contractions, leaking of fluid and fetal movement were reviewed in detail with the patient.  All questions were answered. Has  home bp cuff.. Check bp weekly, let us  know if >140/90.   Follow-up: No follow-ups on file.  Future Appointments  Date Time Provider Department Center  10/24/2024  9:15 AM [facility]  [facility] None  10/24/2024 10:10 AM Ozan, Jennifer, DO [facility]    Orders Placed This Encounter  Procedures   AFP, Serum, Open Spina Bifida   Cathlean Ely DNP, CNM 09/22/2024 2:54 PM  "

## 2024-09-24 LAB — AFP, SERUM, OPEN SPINA BIFIDA
AFP MoM: 1.53
AFP Value: 46.3 ng/mL
Gest. Age on Collection Date: 16.1 wk
Maternal Age At EDD: 25.8 a
OSBR Risk 1 IN: 2523
Test Results:: NEGATIVE
Weight: 174 [lb_av]

## 2024-09-25 LAB — CYTOLOGY - PAP
Comment: NEGATIVE
Diagnosis: NEGATIVE
High risk HPV: NEGATIVE

## 2024-10-24 ENCOUNTER — Ambulatory Visit: Admitting: Obstetrics & Gynecology

## 2024-10-24 ENCOUNTER — Ambulatory Visit

## 2024-10-24 ENCOUNTER — Encounter: Payer: Self-pay | Admitting: Obstetrics & Gynecology

## 2024-10-24 VITALS — BP 113/70 | HR 74 | Wt 178.0 lb

## 2024-10-24 DIAGNOSIS — Z3A20 20 weeks gestation of pregnancy: Secondary | ICD-10-CM

## 2024-10-24 DIAGNOSIS — Z3A12 12 weeks gestation of pregnancy: Secondary | ICD-10-CM

## 2024-10-24 DIAGNOSIS — Z3400 Encounter for supervision of normal first pregnancy, unspecified trimester: Secondary | ICD-10-CM

## 2024-10-24 DIAGNOSIS — Z3402 Encounter for supervision of normal first pregnancy, second trimester: Secondary | ICD-10-CM

## 2024-10-24 NOTE — Progress Notes (Signed)
" ° °  LOW-RISK PREGNANCY VISIT Patient name: Ashlee Schmidt MRN [id number]  Date of birth: Jan 23, 2001 Chief Complaint:   Routine Prenatal Visit  History of Present Illness:   Ashlee Schmidt is a 24 y.o. G32P0000 female at [redacted]w[redacted]d with an Estimated Date of Delivery: 03/08/25 being seen today for ongoing management of a low-risk pregnancy.     08/25/2024    1:28 PM 06/29/2023    9:46 AM 11/16/2017    2:57 PM  Depression screen PHQ 2/9  Decreased Interest 0 0 0  Down, Depressed, Hopeless 0 0 0  PHQ - 2 Score 0 0 0  Altered sleeping 1 1 0  Tired, decreased energy 1 2 0  Change in appetite 0 0 0  Feeling bad or failure about yourself  0 0 0  Trouble concentrating 0 1 0  Moving slowly or fidgety/restless 0 1 0  Suicidal thoughts 0 0 0  PHQ-9 Score 2 5  0   Difficult doing work/chores   Not difficult at all     Data saved with a previous flowsheet row definition    Today she reports no complaints. Contractions: Not present. Vag. Bleeding: None.  Movement: Present. denies leaking of fluid. Review of Systems:   Pertinent items are noted in HPI Denies abnormal vaginal discharge w/ itching/odor/irritation, headaches, visual changes, shortness of breath, chest pain, abdominal pain, severe nausea/vomiting, or problems with urination or bowel movements unless otherwise stated above. Pertinent History Reviewed:  Reviewed past medical,surgical, social, obstetrical and family history.  Reviewed problem list, medications and allergies.  Physical Assessment:   Vitals:   10/24/24 1001  BP: 113/70  Pulse: 74  Weight: 178 lb (80.7 kg)  Body mass index is 31.53 kg/m.        Physical Examination:   General appearance: Well appearing, and in no distress  Mental status: Alert, oriented to person, place, and time  Skin: Warm & dry  Respiratory: Normal respiratory effort, no distress  Abdomen: Soft, gravid, nontender  Pelvic: Cervical exam deferred         Extremities: no edema or calf tenderness  bilaterally   Psych:  mood and affect appropriate  Fetal Status:     Movement: Present   breech,anterior placenta gr 0,CX 3.8 cm,SVP of fluid 5.5 cm,FHR 148 bpm,normal ovaries,EFW 396 g 63%,anatomy complete,no obvious abnormalities   Chaperone: n/a    No results found for this or any previous visit (from the past 24 hours).   Assessment & Plan:  1) Low-risk pregnancy G1P0000 at [redacted]w[redacted]d with an Estimated Date of Delivery: 03/08/25   -Normal anatomy scan today -considering water birth-plan to rule review with midwife at next visit   Meds: No orders of the defined types were placed in this encounter.  Labs/procedures today: Anatomy scan  Plan:  Continue routine obstetrical care  Next visit: prefers in person    Reviewed: Preterm labor symptoms and general obstetric precautions including but not limited to vaginal bleeding, contractions, leaking of fluid and fetal movement were reviewed in detail with the patient.  All questions were answered. Pt has home bp cuff. Check bp weekly, let us  know if >140/90.   Follow-up: Return in about 4 weeks (around 11/21/2024) for LROB visit (CNM please).  No orders of the defined types were placed in this encounter.   Otelia Hettinger, DO Attending Obstetrician & Gynecologist, [facility] for [facility], [facility]    "

## 2024-10-24 NOTE — Progress Notes (Signed)
 US  20+5 wks,breech,anterior placenta gr 0,CX 3.8 cm,SVP of fluid 5.5 cm,FHR 148 bpm,normal ovaries,EFW 396 g 63%,anatomy complete,no obvious abnormalities

## 2024-11-21 ENCOUNTER — Encounter: Admitting: Obstetrics and Gynecology
